# Patient Record
Sex: Female | Born: 1940
Health system: Southern US, Community
[De-identification: ages and names within clinical notes are randomized; demographics above are authoritative.]

## PROBLEM LIST (undated history)

## (undated) DIAGNOSIS — M1990 Unspecified osteoarthritis, unspecified site: Secondary | ICD-10-CM

## (undated) DIAGNOSIS — I341 Nonrheumatic mitral (valve) prolapse: Secondary | ICD-10-CM

## (undated) DIAGNOSIS — Z860100 Personal history of colon polyps, unspecified: Secondary | ICD-10-CM

## (undated) DIAGNOSIS — Z87448 Personal history of other diseases of urinary system: Secondary | ICD-10-CM

## (undated) DIAGNOSIS — F419 Anxiety disorder, unspecified: Secondary | ICD-10-CM

## (undated) DIAGNOSIS — T8859XA Other complications of anesthesia, initial encounter: Secondary | ICD-10-CM

## (undated) DIAGNOSIS — M8580 Other specified disorders of bone density and structure, unspecified site: Secondary | ICD-10-CM

## (undated) DIAGNOSIS — E785 Hyperlipidemia, unspecified: Secondary | ICD-10-CM

## (undated) DIAGNOSIS — T4145XA Adverse effect of unspecified anesthetic, initial encounter: Secondary | ICD-10-CM

## (undated) DIAGNOSIS — Z789 Other specified health status: Secondary | ICD-10-CM

## (undated) DIAGNOSIS — I872 Venous insufficiency (chronic) (peripheral): Secondary | ICD-10-CM

## (undated) DIAGNOSIS — R55 Syncope and collapse: Secondary | ICD-10-CM

## (undated) HISTORY — DX: Hyperlipidemia, unspecified: E78.5

## (undated) HISTORY — DX: Other specified disorders of bone density and structure, unspecified site: M85.80

## (undated) HISTORY — DX: Syncope and collapse: R55

## (undated) HISTORY — DX: Unspecified osteoarthritis, unspecified site: M19.90

## (undated) HISTORY — DX: Nonrheumatic mitral (valve) prolapse: I34.1

## (undated) HISTORY — PX: APPENDECTOMY: SHX54

## (undated) HISTORY — PX: JOINT REPLACEMENT: SHX530

## (undated) HISTORY — DX: Venous insufficiency (chronic) (peripheral): I87.2

## (undated) HISTORY — DX: Anxiety disorder, unspecified: F41.9

## (undated) HISTORY — DX: Personal history of other diseases of urinary system: Z87.448

## (undated) HISTORY — DX: Other specified health status: Z78.9

## (undated) HISTORY — DX: Personal history of colon polyps, unspecified: Z86.0100

## (undated) HISTORY — DX: Personal history of colonic polyps: Z86.010

---

## 1998-02-26 ENCOUNTER — Other Ambulatory Visit: Admission: RE | Admit: 1998-02-26 | Discharge: 1998-02-26 | Payer: Self-pay | Admitting: Obstetrics and Gynecology

## 2000-09-17 ENCOUNTER — Other Ambulatory Visit: Admission: RE | Admit: 2000-09-17 | Discharge: 2000-09-17 | Payer: Self-pay | Admitting: Obstetrics and Gynecology

## 2000-10-15 ENCOUNTER — Encounter: Admission: RE | Admit: 2000-10-15 | Discharge: 2000-10-15 | Payer: Self-pay | Admitting: Obstetrics and Gynecology

## 2000-10-15 ENCOUNTER — Encounter: Payer: Self-pay | Admitting: Obstetrics and Gynecology

## 2001-08-17 HISTORY — PX: OTHER SURGICAL HISTORY: SHX169

## 2002-05-16 ENCOUNTER — Encounter: Payer: Self-pay | Admitting: Urology

## 2002-05-16 ENCOUNTER — Encounter: Admission: RE | Admit: 2002-05-16 | Discharge: 2002-05-16 | Payer: Self-pay | Admitting: Urology

## 2002-12-22 ENCOUNTER — Encounter: Admission: RE | Admit: 2002-12-22 | Discharge: 2002-12-22 | Payer: Self-pay | Admitting: Obstetrics and Gynecology

## 2002-12-22 ENCOUNTER — Encounter: Payer: Self-pay | Admitting: Obstetrics and Gynecology

## 2004-05-22 ENCOUNTER — Encounter: Admission: RE | Admit: 2004-05-22 | Discharge: 2004-05-22 | Payer: Self-pay | Admitting: Obstetrics and Gynecology

## 2004-09-04 ENCOUNTER — Ambulatory Visit: Payer: Self-pay | Admitting: Pulmonary Disease

## 2004-09-16 ENCOUNTER — Ambulatory Visit: Payer: Self-pay | Admitting: Pulmonary Disease

## 2004-09-29 ENCOUNTER — Ambulatory Visit: Payer: Self-pay | Admitting: Gastroenterology

## 2004-10-08 ENCOUNTER — Ambulatory Visit: Payer: Self-pay | Admitting: Gastroenterology

## 2004-10-28 ENCOUNTER — Ambulatory Visit (HOSPITAL_BASED_OUTPATIENT_CLINIC_OR_DEPARTMENT_OTHER): Admission: RE | Admit: 2004-10-28 | Discharge: 2004-10-28 | Payer: Self-pay | Admitting: Orthopedic Surgery

## 2004-10-28 ENCOUNTER — Ambulatory Visit (HOSPITAL_COMMUNITY): Admission: RE | Admit: 2004-10-28 | Discharge: 2004-10-28 | Payer: Self-pay | Admitting: Orthopedic Surgery

## 2004-10-28 ENCOUNTER — Encounter (INDEPENDENT_AMBULATORY_CARE_PROVIDER_SITE_OTHER): Payer: Self-pay | Admitting: *Deleted

## 2005-03-23 ENCOUNTER — Ambulatory Visit: Payer: Self-pay | Admitting: Pulmonary Disease

## 2005-05-29 ENCOUNTER — Ambulatory Visit: Payer: Self-pay | Admitting: Pulmonary Disease

## 2005-06-09 ENCOUNTER — Encounter: Admission: RE | Admit: 2005-06-09 | Discharge: 2005-06-09 | Payer: Self-pay | Admitting: Obstetrics and Gynecology

## 2005-08-17 HISTORY — PX: OTHER SURGICAL HISTORY: SHX169

## 2006-02-15 ENCOUNTER — Ambulatory Visit: Payer: Self-pay | Admitting: Pulmonary Disease

## 2006-02-22 ENCOUNTER — Ambulatory Visit: Payer: Self-pay | Admitting: Pulmonary Disease

## 2006-06-08 ENCOUNTER — Ambulatory Visit: Payer: Self-pay | Admitting: Pulmonary Disease

## 2006-06-29 ENCOUNTER — Encounter: Admission: RE | Admit: 2006-06-29 | Discharge: 2006-06-29 | Payer: Self-pay | Admitting: Obstetrics and Gynecology

## 2007-05-26 ENCOUNTER — Ambulatory Visit: Payer: Self-pay | Admitting: Pulmonary Disease

## 2007-12-13 ENCOUNTER — Encounter: Payer: Self-pay | Admitting: Pulmonary Disease

## 2007-12-13 ENCOUNTER — Ambulatory Visit: Payer: Self-pay | Admitting: Internal Medicine

## 2007-12-31 ENCOUNTER — Encounter: Payer: Self-pay | Admitting: Pulmonary Disease

## 2008-02-10 DIAGNOSIS — T7840XA Allergy, unspecified, initial encounter: Secondary | ICD-10-CM | POA: Insufficient documentation

## 2008-02-10 DIAGNOSIS — E785 Hyperlipidemia, unspecified: Secondary | ICD-10-CM | POA: Insufficient documentation

## 2008-02-10 DIAGNOSIS — M1990 Unspecified osteoarthritis, unspecified site: Secondary | ICD-10-CM | POA: Insufficient documentation

## 2008-02-10 DIAGNOSIS — I872 Venous insufficiency (chronic) (peripheral): Secondary | ICD-10-CM | POA: Insufficient documentation

## 2008-02-10 DIAGNOSIS — Z87448 Personal history of other diseases of urinary system: Secondary | ICD-10-CM | POA: Insufficient documentation

## 2008-02-13 ENCOUNTER — Ambulatory Visit: Payer: Self-pay | Admitting: Pulmonary Disease

## 2008-02-13 DIAGNOSIS — M899 Disorder of bone, unspecified: Secondary | ICD-10-CM | POA: Insufficient documentation

## 2008-02-13 DIAGNOSIS — M949 Disorder of cartilage, unspecified: Secondary | ICD-10-CM

## 2008-02-13 DIAGNOSIS — D126 Benign neoplasm of colon, unspecified: Secondary | ICD-10-CM | POA: Insufficient documentation

## 2008-06-08 ENCOUNTER — Ambulatory Visit: Payer: Self-pay | Admitting: Pulmonary Disease

## 2008-07-16 ENCOUNTER — Telehealth: Payer: Self-pay | Admitting: Pulmonary Disease

## 2008-09-17 ENCOUNTER — Inpatient Hospital Stay (HOSPITAL_COMMUNITY): Admission: RE | Admit: 2008-09-17 | Discharge: 2008-09-20 | Payer: Self-pay | Admitting: Orthopedic Surgery

## 2008-09-17 HISTORY — PX: TOTAL HIP ARTHROPLASTY: SHX124

## 2009-04-17 HISTORY — PX: OTHER SURGICAL HISTORY: SHX169

## 2009-04-29 ENCOUNTER — Inpatient Hospital Stay (HOSPITAL_COMMUNITY): Admission: EM | Admit: 2009-04-29 | Discharge: 2009-05-06 | Payer: Self-pay | Admitting: Emergency Medicine

## 2009-04-29 ENCOUNTER — Encounter (INDEPENDENT_AMBULATORY_CARE_PROVIDER_SITE_OTHER): Payer: Self-pay | Admitting: General Surgery

## 2009-04-29 ENCOUNTER — Emergency Department (HOSPITAL_COMMUNITY): Admission: EM | Admit: 2009-04-29 | Discharge: 2009-04-29 | Payer: Self-pay | Admitting: Family Medicine

## 2009-05-31 ENCOUNTER — Ambulatory Visit: Payer: Self-pay | Admitting: Pulmonary Disease

## 2009-08-28 ENCOUNTER — Encounter (INDEPENDENT_AMBULATORY_CARE_PROVIDER_SITE_OTHER): Payer: Self-pay | Admitting: *Deleted

## 2009-10-25 ENCOUNTER — Encounter (INDEPENDENT_AMBULATORY_CARE_PROVIDER_SITE_OTHER): Payer: Self-pay | Admitting: *Deleted

## 2009-10-28 ENCOUNTER — Ambulatory Visit: Payer: Self-pay | Admitting: Gastroenterology

## 2009-11-06 ENCOUNTER — Ambulatory Visit: Payer: Self-pay | Admitting: Gastroenterology

## 2009-11-07 ENCOUNTER — Encounter: Payer: Self-pay | Admitting: Gastroenterology

## 2010-04-17 ENCOUNTER — Ambulatory Visit: Payer: Self-pay | Admitting: Pulmonary Disease

## 2010-04-20 DIAGNOSIS — F411 Generalized anxiety disorder: Secondary | ICD-10-CM | POA: Insufficient documentation

## 2010-05-02 ENCOUNTER — Encounter: Payer: Self-pay | Admitting: Pulmonary Disease

## 2010-05-02 ENCOUNTER — Ambulatory Visit: Payer: Self-pay | Admitting: Internal Medicine

## 2010-09-16 NOTE — Letter (Signed)
Summary: Patient Notice- Polyp Results  [facility]  [address] [place], [place] [address]   Phone: [phone]  Fax: [phone]        November 07, 2009 MRN: [id number]    [facility] [address] [place], [place]  [address]    Dear Ms. Allcock,  I am pleased to inform you that the colon polyp(s) removed during your recent colonoscopy was (were) found to be benign (no cancer detected) upon pathologic examination.  I recommend you have a repeat colonoscopy examination in 5 years to look for recurrent polyps, as having colon polyps increases your risk for having recurrent polyps or even colon cancer in the future.  Should you develop new or worsening symptoms of abdominal pain, bowel habit changes or bleeding from the rectum or bowels, please schedule an evaluation with either your primary care physician or with me.  Continue treatment plan as outlined the day of your exam.  Please call us if you are having persistent problems or have questions about your condition that have not been fully answered at this time.  Sincerely,  Meryl Dare MD [facility]  This letter has been electronically signed by your physician.  Appended Document: Patient Notice- Polyp Results letter mailed 3.28.11

## 2010-09-16 NOTE — Procedures (Signed)
Summary: Colonoscopy  Patient: Morgan [name] Note: All result statuses are Final unless otherwise noted.  Tests: (1) Colonoscopy (COL)   COL Colonoscopy           DONE      [facility]     [address]     [place], [place]  [address]           COLONOSCOPY PROCEDURE REPORT           PATIENT:  [name], [name]  MR#:  [id number]     BIRTHDATE:  November 03, 1940, 68 yrs. old  GENDER:  female           ENDOSCOPIST:  Judie Petit T. Russella Dar, MD, [facility]           PROCEDURE DATE:  11/06/2009     PROCEDURE:  Colonoscopy with biopsy     ASA CLASS:  Class II     INDICATIONS:  1) follow-up of polyp, adenomatous polyp, 09/2004.           MEDICATIONS:   Fentanyl 75 mcg IV, Versed 7 mg IV           DESCRIPTION OF PROCEDURE:   After the risks benefits and     alternatives of the procedure were thoroughly explained, informed     consent was obtained.  Digital rectal exam was performed and     revealed no abnormalities.   The LB [id number]-[id number] endoscope     was introduced through the anus and advanced to the cecum, which     was identified by both the appendix and ileocecal valve, without     limitations.  The quality of the prep was excellent, using     MoviPrep.  The instrument was then slowly withdrawn as the colon     was fully examined.     <<PROCEDUREIMAGES>>           FINDINGS:  A sessile polyp was found in the cecum. It was 3 mm in     size. The polyp was removed using cold biopsy forceps.  A sessile     polyp was found in the proximal transverse colon. It was 4 mm in     size. The polyp was removed using cold biopsy forceps.  A sessile     polyp was found in the sigmoid colon. It was 3 mm in size. The     polyp was removed using cold biopsy forceps.  A sessile polyp was     found in the rectum. It was 4 mm in size. The polyp was removed     using cold biopsy forceps.  This was otherwise a normal     examination of the colon.  Retroflexed views in the rectum     revealed no abnormalities.  The time to cecum =  2.67  minutes. The     scope was then withdrawn (time =  9.25  min) from the patient and     the procedure completed.           COMPLICATIONS:  None           ENDOSCOPIC IMPRESSION:     1) 3 mm sessile polyp in the cecum     2) 4 mm sessile polyp in the proximal transverse colon     3) 3 mm sessile polyp in the sigmoid colon     4) 4 mm sessile polyp in the rectum           RECOMMENDATIONS:  1) Await pathology results     2) Repeat Colonoscopy in 5 years.           Venita Lick. Russella Dar, MD, [facility]           CC: Michele Mcalpine, MD           n.     Rosalie DoctorVenita Lick. Caelen Reierson at 11/06/2009 11:42 AM           Ilda Foil, [id number]  Note: An exclamation mark (!) indicates a result that was not dispersed into the flowsheet. Document Creation Date: 11/06/2009 11:43 AM _______________________________________________________________________  (1) Order result status: Final Collection or observation date-time: 11/06/2009 11:38 Requested date-time:  Receipt date-time:  Reported date-time:  Referring Physician:   Ordering Physician: Claudette Head [phone]) Specimen Source:  Source: Launa Grill Order Number: [id number] Lab site:   Appended Document: Colonoscopy     Procedures Next Due Date:    Colonoscopy: 10/2014

## 2010-09-16 NOTE — Miscellaneous (Signed)
Summary: [facility] Previsit/prep  Clinical Lists Changes  Medications: Added new medication of MOVIPREP 100 GM  SOLR (PEG-KCL-NACL-NASULF-NA ASC-C) As per prep instructions. - Signed Rx of MOVIPREP 100 GM  SOLR (PEG-KCL-NACL-NASULF-NA ASC-C) As per prep instructions.;  #1 x 0;  Signed;  Entered by: Wyona Almas RN;  Authorized by: Meryl Dare MD [facility];  Method used: Electronically to [facility]  [phone]*, [address] [place], [place], [place]  [address], Ph: [phone] or [phone], Fax: [phone] Allergies: Added new allergy or adverse reaction of PENICILLIN    Prescriptions: MOVIPREP 100 GM  SOLR (PEG-KCL-NACL-NASULF-NA ASC-C) As per prep instructions.  #1 x 0   Entered by:   Wyona Almas RN   Authorized by:   Meryl Dare MD [facility]   Signed by:   Wyona Almas RN on 10/28/2009   Method used:   Electronically to        [facility]  [phone]* (retail)       [address]       [place], [place]  [address]       Ph: [phone] or [phone]       Fax: [phone]   RxID:   [id number]

## 2010-09-16 NOTE — Letter (Signed)
Summary: Colonoscopy Letter  [facility]  [address] [place], [place] [address]   Phone: [phone]  Fax: [phone]      August 28, 2009 MRN: [id number]   [facility] [address] [place], [place]  [address]   Dear Ms. Trefry,   According to your medical record, it is time for you to schedule a Colonoscopy. The American Cancer Society recommends this procedure as a method to detect early colon cancer. Patients with a family history of colon cancer, or a personal history of colon polyps or inflammatory bowel disease are at increased risk.  This letter has beeen generated based on the recommendations made at the time of your procedure. If you feel that in your particular situation this may no longer apply, please contact our office.  Please call our office at [phone] to schedule this appointment or to update your records at your earliest convenience.  Thank you for cooperating with [place] to provide you with the very best care possible.   Sincerely,  Judie Petit T. Russella Dar, M.D.  [facility] [phone]

## 2010-09-16 NOTE — Miscellaneous (Signed)
Summary: BONE DENSITY  Clinical Lists Changes  Orders: Added new Test order of T-Bone Densitometry [phone]) - Signed Added new Test order of T-Lumbar Vertebral Assessment [phone]) - Signed  Appended Document: BONE DENSITY called and spoke with pt and she is aware per SN the results of the BMD----this compared to 2009 is slightly worse in the hip---recs are to cont the same meds and add alendronate 70mg  once weekly---she wanted this sent to [facility].  Appended Document: alendronate added to meds    Clinical Lists Changes  Medications: Added new medication of ALENDRONATE SODIUM 70 MG TABS (ALENDRONATE SODIUM) take one tablet by mouth once weekly - Signed Rx of ALENDRONATE SODIUM 70 MG TABS (ALENDRONATE SODIUM) take one tablet by mouth once weekly;  #4 x 11;  Signed;  Entered by: Randell Loop CMA;  Authorized by: Michele Mcalpine MD;  Method used: Electronically to [facility]  [phone]*, [address] [place], [place], [place]  [address], Ph: [phone] or [phone], Fax: [phone]    Prescriptions: ALENDRONATE SODIUM 70 MG TABS (ALENDRONATE SODIUM) take one tablet by mouth once weekly  #4 x 11   Entered by:   Randell Loop CMA   Authorized by:   Michele Mcalpine MD   Signed by:   Randell Loop CMA on 05/16/2010   Method used:   Electronically to        [facility]  [phone]* (retail)       [address]       [place], [place]  [address]       Ph: [phone] or [phone]       Fax: [phone]   RxID:   [id number]

## 2010-09-16 NOTE — Letter (Signed)
Summary: [facility] Instructions   [facility]  [address] [place], [place] [address]   Phone: [phone]  Fax: [phone]       Morgan [name]    1941-03-13    MRN: [id number]        Procedure Day Dorna Bloom: Wednesday 11/06/09     Arrival Time: 10:00 am     Procedure Time: 11:00 am     Location of Procedure:                    _x_  [facility] (4th Floor)                        PREPARATION FOR COLONOSCOPY WITH MOVIPREP   Starting 5 days prior to your procedure 11/01/09 do not eat nuts, seeds, popcorn, corn, beans, peas,  salads, or any raw vegetables.  Do not take any fiber supplements (e.g. Metamucil, Citrucel, and Benefiber).  THE DAY BEFORE YOUR PROCEDURE         DATE: 11/05/09  DAY: Tuesday  1.  Drink clear liquids the entire day-NO SOLID FOOD  2.  Do not drink anything colored red or purple.  Avoid juices with pulp.  No orange juice.  3.  Drink at least 64 oz. (8 glasses) of fluid/clear liquids during the day to prevent dehydration and help the prep work efficiently.  CLEAR LIQUIDS INCLUDE: Water Jello Ice Popsicles Tea (sugar ok, no milk/cream) Powdered fruit flavored drinks Coffee (sugar ok, no milk/cream) Gatorade Juice: apple, white grape, white cranberry  Lemonade Clear bullion, consomm, broth Carbonated beverages (any kind) Strained chicken noodle soup Hard Candy                             4.  In the morning, mix first dose of MoviPrep solution:    Empty 1 Pouch A and 1 Pouch B into the disposable container    Add lukewarm drinking water to the top line of the container. Mix to dissolve    Refrigerate (mixed solution should be used within 24 hrs)  5.  Begin drinking the prep at 5:00 p.m. The MoviPrep container is divided by 4 marks.   Every 15 minutes drink the solution down to the next mark (approximately 8 oz) until the full liter is complete.   6.  Follow completed prep with 16 oz of clear liquid of your choice (Nothing  red or purple).  Continue to drink clear liquids until bedtime.  7.  Before going to bed, mix second dose of MoviPrep solution:    Empty 1 Pouch A and 1 Pouch B into the disposable container    Add lukewarm drinking water to the top line of the container. Mix to dissolve    Refrigerate  THE DAY OF YOUR PROCEDURE      DATE: 11/06/09 DAY: Wednesday  Beginning at  6:00 a.m. (5 hours before procedure):         1. Every 15 minutes, drink the solution down to the next mark (approx 8 oz) until the full liter is complete.  2. Follow completed prep with 16 oz. of clear liquid of your choice.    3. You may drink clear liquids until 9:00 am (2 HOURS BEFORE PROCEDURE).   MEDICATION INSTRUCTIONS  Unless otherwise instructed, you should take regular prescription medications with a small sip of water   as early as possible the morning of  your procedure.           OTHER INSTRUCTIONS  You will need a responsible adult at least 72 years of age to accompany you and drive you home.   This person must remain in the waiting room during your procedure.  Wear loose fitting clothing that is easily removed.  Leave jewelry and other valuables at home.  However, you may wish to bring a book to read or  an iPod/MP3 player to listen to music as you wait for your procedure to start.  Remove all body piercing jewelry and leave at home.  Total time from sign-in until discharge is approximately 2-3 hours.  You should go home directly after your procedure and rest.  You can resume normal activities the  day after your procedure.  The day of your procedure you should not:   Drive   Make legal decisions   Operate machinery   Drink alcohol   Return to work  You will receive specific instructions about eating, activities and medications before you leave.    The above instructions have been reviewed and explained to me by   Wyona Almas RN  October 28, 2009 9:39 AM     I fully  understand and can verbalize these instructions _____________________________ Date _________

## 2010-09-16 NOTE — Assessment & Plan Note (Signed)
Summary: rov//mbw   CC:  26 month ROV & review of mult medical problems....  History of Present Illness: 70 y/o WF here for a follow up visit...    ~  Jun09:  she was last seen 7/07- her husband, Charnese Federici died last yr w/ complic of DM... she states that she has been doing well and has no new complaints or concerns... she was prev on Actonel for Osteopenia but stopped this med due to bone pain and hurting all over (she states that she feels better off this Rx)... she has several medical issues as noted below:   ~  April 17, 2010:  she had right THR 2/10 by DrAlusio... she was adm 9/10 for ruptured appendix w/ laparoscopic surg DrThompson- appendectomy & drainage of abscess... she had f/u w/ GI- DrStark 3/11 w/ colonoscopy showing several 3-10mm polyps that were tubular adenomas on bx, f/u planned 58yrs... she has hx ?MVP- denies CP, palpit, SOB;  hx signif elevation in Chol but she refuses Statin Rx "I'm on FishOil" & refuses FLP recheck!;  notes under incr stress w/ father's illness but does not want meds... OK Flu vaccine & PNEUMOVAX today (age 29).   Current Problem List  ALLERGY (ICD-995.3) - no recent problems... uses OTC meds as needed.  ? of MITRAL VALVE PROLAPSE (ICD-424.0) - long hx of intermittent palpit and occas sharp CP... prev exams w/ intermittent MSC heard... never required 2DEcho or other eval... currently asymptomatic without CP, palpit, dizziness, SOB, etc...  VENOUS INSUFFICIENCY (ICD-459.81) - mild VI changes and no signif edema... she knows to avoid sodium, elevate legs, wear support hose, etc...  HYPERLIPIDEMIA (ICD-272.4) - signif elevation of Chol in the past... intol to Lipitor w/ bone pain and hurting all over... refused to try other statins, or other Chol meds, preferring to attempt control on diet alone... she is not fasting today and does not wish to return for f/u FLP or other labs...  ~  FLP 5/05 showed TChol 259, TG 46, HDL 64, LDL 182... she tried  Lipitor, then stopped...  ~  FLP 7/07 showed TChol 230, TG 54, HDL 50, LDL 187... she refused Simvastatin trial...  ~  she has declines fasting blood work since Jul07.  COLONIC POLYPS (ICD-211.3) - colonoscopy 2/06 by DrStark showed 2mm polyps w/ adenomatous and hyperplastic changes...  f/u colonoscopy done 3/11 w/ several 3-53mm polyps removed- path= tubular adenoma & f/u planned 56yrs.  HEMATURIA, HX OF (ICD-V13.09) - eval 2003 by DrTannenbaum w/ neg sonar and cysto...  DEGENERATIVE JOINT DISEASE (ICD-715.90) - s/p right hand surgery 2006 by DrAplington to remove some benign nodules...  ~  she has right THR 2/10 by DrAlusio...  OSTEOPENIA (ICD-733.90) - BMD 4/09 w/ TScores -1.5 in sp, & -2.1 in hip... she takes ca++, MVI, VitD... she refuses bisphosphonate therapy...  she is rec to get a f/u BMD & asked to sched this at her convenience.  ANXIETY - c/o incr stress w/ father's illness... she is offered Rebeca Allegra Rx but she declines.  DERM = DrHall w/ mult SK's and actinic areas treated in the past... Basal cell skin cancer removed from right ear by drRosen in 2007...   Preventive Screening-Counseling & Management  Alcohol-Tobacco     Smoking Status: never  Allergies: 1)  ! Lipitor (Atorvastatin Calcium) 2)  ! Penicillin  Comments:  Nurse/Medical Assistant: The patient's medications and allergies were reviewed with the patient and were updated in the Medication and Allergy Lists.  Past History:  Past Medical History: ALLERGY (ICD-995.3) ? of MITRAL VALVE PROLAPSE (ICD-424.0) VENOUS INSUFFICIENCY (ICD-459.81) HYPERLIPIDEMIA (ICD-272.4) COLONIC POLYPS (ICD-211.3) HEMATURIA, HX OF (ICD-V13.09) DEGENERATIVE JOINT DISEASE (ICD-715.90) OSTEOPENIA (ICD-733.90) ANXIETY (ICD-300.00)  Past Surgical History: S/P right hand surgery 12/27/2001 by DrAplington S/P right ear surgery 12-27-05 by DrRosen (skin cancer)  Family History: Reviewed history and no changes required.  Social  History: Reviewed history from 02/13/2008 and no changes required. Husband, Quida Glasser, passed away in Dec 28, 2006 2 children non smoker no etoh retired  Review of Systems  The patient denies fever, chills, sweats, anorexia, fatigue, weakness, malaise, weight loss, sleep disorder, blurring, diplopia, eye irritation, eye discharge, vision loss, eye pain, photophobia, earache, ear discharge, tinnitus, decreased hearing, nasal congestion, nosebleeds, sore throat, hoarseness, chest pain, palpitations, syncope, dyspnea on exertion, orthopnea, PND, peripheral edema, cough, dyspnea at rest, excessive sputum, hemoptysis, wheezing, pleurisy, nausea, vomiting, diarrhea, constipation, change in bowel habits, abdominal pain, melena, hematochezia, jaundice, gas/bloating, indigestion/heartburn, dysphagia, odynophagia, dysuria, hematuria, urinary frequency, urinary hesitancy, nocturia, incontinence, back pain, joint pain, joint swelling, muscle cramps, muscle weakness, stiffness, arthritis, sciatica, restless legs, leg pain at night, leg pain with exertion, rash, itching, dryness, suspicious lesions, paralysis, paresthesias, seizures, tremors, vertigo, transient blindness, frequent falls, frequent headaches, difficulty walking, depression, anxiety, memory loss, confusion, cold intolerance, heat intolerance, polydipsia, polyphagia, polyuria, unusual weight change, abnormal bruising, bleeding, enlarged lymph nodes, urticaria, allergic rash, hay fever, and recurrent infections.    Vital Signs:  Patient profile:   70 year old female Height:      66.5 inches Weight:      190.50 pounds BMI:     30.40 O2 Sat:      97 % on Room air Temp:     97.8 degrees F oral Pulse rate:   65 / minute BP sitting:   120 / 78  (right arm) Cuff size:   regular  Vitals Entered By: Randell Loop CMA (April 17, 2010 10:34 AM)  O2 Sat at Rest %:  97 O2 Flow:  Room air CC: 26 month ROV & review of mult medical problems... Is Patient  Diabetic? No Pain Assessment Patient in pain? no      Comments meds updated today with pt   Physical Exam  Additional Exam:  WD, WN, 70 y/o WF in NAD... GENERAL:  Alert & oriented; pleasant & cooperative... HEENT:  Wells/AT, EOM-wnl, PERRLA, EACs-clear, TMs-wnl, NOSE-clear, THROAT-clear & wnl, dentures... NECK:  Supple w/ full ROM; no JVD; normal carotid impulses w/o bruits; palp thyroid w/o nodules ;  no lymphadenopathy. CHEST:  Clear to P & A; without wheezes/ rales/ or rhonchi. HEART:  Regular Rhythm; without murmurs/ rubs/ or gallops. ABDOMEN:  Soft & nontender; normal bowel sounds; no organomegaly or masses detected. EXT: without deformities, mod arthritic changes; no varicose veins/ +venous insuffic/ no edema. NEURO:  CN's intact; no focal neuro deficits... DERM:  No lesions noted; no rash etc...    Impression & Recommendations:  Problem # 1:  ? of MITRAL VALVE PROLAPSE (ICD-424.0) She is asymptomatic w/o CP palpit SOB etc... Her updated medication list for this problem includes:    Aspirin 81 Mg Tbec (Aspirin) .Marland Kitchen... Take 1 tab by mouth once daily...  Problem # 2:  HYPERLIPIDEMIA (ICD-272.4) She has categorically refused meds and even refuses f/u FLP... "I'm taking fish Oil"...  Problem # 3:  COLONIC POLYPS (ICD-211.3) S/p colonoscopy w/ several 3-21mm tubular adenomas removed... f/u planned 79yrs...  Problem # 4:  S/P APPENDECTOMY>>> She has acute appendicitis w/  perf & surg DrThompson 9/10...  Problem # 5:  DEGENERATIVE JOINT DISEASE (ICD-715.90) S/p right THR by DrAlusio 2./10... Her updated medication list for this problem includes:    Aspirin 81 Mg Tbec (Aspirin) .Marland Kitchen... Take 1 tab by mouth once daily...  Problem # 6:  OSTEOPENIA (ICD-733.90) She will sched a f/u BMD at her convenience...  Problem # 7:  OTHER MEDICAL PROBLEMS AS NOTED>>> OK 2011 Flu vccine today, and PNEUMOVAX...  Complete Medication List: 1)  Aspirin 81 Mg Tbec (Aspirin) .... Take 1 tab by  mouth once daily.Marland KitchenMarland Kitchen 2)  Fish Oil 1000 Mg Caps (Omega-3 fatty acids) .... Take 1 tablet by mouth once a day 3)  Caltrate 600+d 600-400 Mg-unit Tabs (Calcium carbonate-vitamin d) .... Take 1 tab by mouth once daily.Marland Kitchen 4)  Multivitamins Tabs (Multiple vitamin) .... Take 1 tablet by mouth once daily 5)  Vitamin D3 1000 Unit Caps (Cholecalciferol) .... Take 1 cap by mouth once daily...  Other Orders: Pneumococcal Vaccine ([address]) Admin 1st Vaccine (09811) Flu Vaccine 70yrs + [phone]) Administration Flu vaccine - MCR (G9562)  Patient Instructions: 1)  Today we updated your med list- see below.... 2)  We gave you the 2011 seasonal Flu vaccine today, and the PNEUMOVAX (this is the only Pneumonia vaccine you will need)...  3)  Please get on a good low cholesterol/ low fat diet & get your weight down> you will not be able to control your Lipids on diet alone unless you lose a substantial amount of weight... 4)  Continue daily exercise/ walking/ weight bearing exercise for your bones... plus the calcium, MVI, Vit D supplements... sched the follow up bone density test at your convenience... 5)  Call for any problems.Marland KitchenMarland Kitchen 6)  Please schedule a follow-up appointment in 1 year, sooner as needed.   Immunizations Administered:  Pneumonia Vaccine:    Vaccine Type: Pneumovax    Site: right deltoid    Mfr: Merck    Dose: 0.5 ml    Route: IM    Given by: Boone Master CNA/MA    Exp. Date: 09/03/2011    Lot #: [id number]    VIS given: 03/14/96 version given April 17, 2010.            Flu Vaccine Consent Questions     Do you have a history of severe allergic reactions to this vaccine? no    Any prior history of allergic reactions to egg and/or gelatin? no    Do you have a sensitivity to the preservative Thimersol? no    Do you have a past history of Guillan-Barre Syndrome? no    Do you currently have an acute febrile illness? no    Have you ever had a severe reaction to latex? no    Vaccine  information given and explained to patient? yes    Are you currently pregnant? no    Lot Number:[id number]   Exp Date:02/14/2011   Site Given  Left Deltoid IMlu  Boone Master CNA/MA  April 17, 2010 12:10 PM

## 2010-11-21 LAB — URINE MICROSCOPIC-ADD ON

## 2010-11-21 LAB — DIFFERENTIAL
Basophils Absolute: 0 10*3/uL (ref 0.0–0.1)
Basophils Relative: 0 % (ref 0–1)
Eosinophils Absolute: 0.2 10*3/uL (ref 0.0–0.7)
Eosinophils Absolute: 0 10*3/uL (ref 0.0–0.7)
Eosinophils Relative: 5 % (ref 0–5)
Eosinophils Relative: 0 % (ref 0–5)
Lymphocytes Relative: 17 % (ref 12–46)
Lymphocytes Relative: 23 % (ref 12–46)
Lymphs Abs: 0.9 10*3/uL (ref 0.7–4.0)
Lymphs Abs: 1.1 10*3/uL (ref 0.7–4.0)
Lymphs Abs: 0.5 10*3/uL — ABNORMAL LOW (ref 0.7–4.0)
Monocytes Absolute: 0.6 10*3/uL (ref 0.1–1.0)
Monocytes Relative: 9 % (ref 3–12)
Monocytes Relative: 5 % (ref 3–12)
Neutro Abs: 3.6 10*3/uL (ref 1.7–7.7)
Neutrophils Relative %: 69 % (ref 43–77)

## 2010-11-21 LAB — CBC
HCT: 30.6 % — ABNORMAL LOW (ref 36.0–46.0)
HCT: 32 % — ABNORMAL LOW (ref 36.0–46.0)
HCT: 31.2 % — ABNORMAL LOW (ref 36.0–46.0)
Hemoglobin: 10.9 g/dL — ABNORMAL LOW (ref 12.0–15.0)
Hemoglobin: 10.8 g/dL — ABNORMAL LOW (ref 12.0–15.0)
Hemoglobin: 12.8 g/dL (ref 12.0–15.0)
MCHC: 34.3 g/dL (ref 30.0–36.0)
MCHC: 34.5 g/dL (ref 30.0–36.0)
MCV: 90.2 fL (ref 78.0–100.0)
MCV: 90.4 fL (ref 78.0–100.0)
Platelets: 200 10*3/uL (ref 150–400)
Platelets: 285 10*3/uL (ref 150–400)
RBC: 3.54 MIL/uL — ABNORMAL LOW (ref 3.87–5.11)
RBC: 3.41 MIL/uL — ABNORMAL LOW (ref 3.87–5.11)
RBC: 4.11 MIL/uL (ref 3.87–5.11)
RDW: 13.7 % (ref 11.5–15.5)
WBC: 4.7 10*3/uL (ref 4.0–10.5)
WBC: 5.2 10*3/uL (ref 4.0–10.5)
WBC: 5.6 10*3/uL (ref 4.0–10.5)

## 2010-11-21 LAB — COMPREHENSIVE METABOLIC PANEL
ALT: 15 U/L (ref 0–35)
AST: 21 U/L (ref 0–37)
Albumin: 3.3 g/dL — ABNORMAL LOW (ref 3.5–5.2)
Alkaline Phosphatase: 81 U/L (ref 39–117)
Calcium: 8.4 mg/dL (ref 8.4–10.5)
GFR calc Af Amer: 58 mL/min — ABNORMAL LOW (ref >60)
Glucose, Bld: 154 mg/dL — ABNORMAL HIGH (ref 70–99)
Potassium: 3.8 mEq/L (ref 3.5–5.1)
Sodium: 136 mEq/L (ref 135–145)
Total Protein: 5.9 g/dL — ABNORMAL LOW (ref 6.0–8.3)

## 2010-11-21 LAB — URINALYSIS, ROUTINE W REFLEX MICROSCOPIC
Bilirubin Urine: NEGATIVE
Glucose, UA: NEGATIVE mg/dL
Glucose, UA: NEGATIVE mg/dL
Ketones, ur: 15 mg/dL — AB
Protein, ur: 30 mg/dL — AB
Specific Gravity, Urine: 1.011 (ref 1.005–1.030)
Urobilinogen, UA: 1 mg/dL (ref 0.0–1.0)
pH: 5.5 (ref 5.0–8.0)

## 2010-11-21 LAB — WOUND CULTURE

## 2010-11-21 LAB — URINE CULTURE

## 2010-11-21 LAB — ANAEROBIC CULTURE

## 2010-11-21 LAB — LACTIC ACID, PLASMA: Lactic Acid, Venous: 1.6 mmol/L (ref 0.5–2.2)

## 2010-12-01 LAB — COMPREHENSIVE METABOLIC PANEL
AST: 20 U/L (ref 0–37)
Albumin: 3.3 g/dL — ABNORMAL LOW (ref 3.5–5.2)
BUN: 14 mg/dL (ref 6–23)
CO2: 26 mEq/L (ref 19–32)
Calcium: 9.3 mg/dL (ref 8.4–10.5)
Creatinine, Ser: 0.82 mg/dL (ref 0.4–1.2)
GFR calc Af Amer: >60 mL/min (ref >60)
GFR calc non Af Amer: >60 mL/min (ref >60)
Total Bilirubin: 0.6 mg/dL (ref 0.3–1.2)

## 2010-12-01 LAB — CBC
MCHC: 33.7 g/dL (ref 30.0–36.0)
MCV: 90.2 fL (ref 78.0–100.0)
RBC: 4.2 MIL/uL (ref 3.87–5.11)
RDW: 13.6 % (ref 11.5–15.5)

## 2010-12-01 LAB — URINE MICROSCOPIC-ADD ON

## 2010-12-01 LAB — APTT: aPTT: 32 seconds (ref 24–37)

## 2010-12-01 LAB — URINALYSIS, ROUTINE W REFLEX MICROSCOPIC
Bilirubin Urine: NEGATIVE
Ketones, ur: NEGATIVE mg/dL
Leukocytes, UA: NEGATIVE
Nitrite: NEGATIVE
Protein, ur: NEGATIVE mg/dL
Urobilinogen, UA: 0.2 mg/dL (ref 0.0–1.0)
pH: 5.5 (ref 5.0–8.0)

## 2010-12-01 LAB — PROTIME-INR: INR: 1 (ref 0.00–1.49)

## 2010-12-02 LAB — PROTIME-INR
INR: 1.2 (ref 0.00–1.49)
INR: 1.5 (ref 0.00–1.49)
INR: 1.7 — ABNORMAL HIGH (ref 0.00–1.49)
Prothrombin Time: 15.1 seconds (ref 11.6–15.2)
Prothrombin Time: 18.4 seconds — ABNORMAL HIGH (ref 11.6–15.2)

## 2010-12-02 LAB — CBC
HCT: 26.8 % — ABNORMAL LOW (ref 36.0–46.0)
Hemoglobin: 9.3 g/dL — ABNORMAL LOW (ref 12.0–15.0)
Hemoglobin: 9.6 g/dL — ABNORMAL LOW (ref 12.0–15.0)
MCHC: 34.7 g/dL (ref 30.0–36.0)
MCV: 89.7 fL (ref 78.0–100.0)
MCV: 89.9 fL (ref 78.0–100.0)
Platelets: 180 10*3/uL (ref 150–400)
RBC: 2.98 MIL/uL — ABNORMAL LOW (ref 3.87–5.11)
RBC: 3.13 MIL/uL — ABNORMAL LOW (ref 3.87–5.11)
RBC: 3.19 MIL/uL — ABNORMAL LOW (ref 3.87–5.11)
WBC: 5.1 10*3/uL (ref 4.0–10.5)
WBC: 5.6 10*3/uL (ref 4.0–10.5)

## 2010-12-02 LAB — TYPE AND SCREEN
ABO/RH(D): O POS
Antibody Screen: NEGATIVE

## 2010-12-02 LAB — BASIC METABOLIC PANEL
CO2: 25 mEq/L (ref 19–32)
Calcium: 8.2 mg/dL — ABNORMAL LOW (ref 8.4–10.5)
Chloride: 107 mEq/L (ref 96–112)
Creatinine, Ser: 0.93 mg/dL (ref 0.4–1.2)
GFR calc Af Amer: >60 mL/min (ref >60)
GFR calc Af Amer: >60 mL/min (ref >60)
GFR calc non Af Amer: >60 mL/min (ref >60)
Glucose, Bld: 127 mg/dL — ABNORMAL HIGH (ref 70–99)
Potassium: 3.9 mEq/L (ref 3.5–5.1)
Sodium: 135 mEq/L (ref 135–145)
Sodium: 137 mEq/L (ref 135–145)

## 2010-12-30 NOTE — Op Note (Signed)
Morgan [name], Morgan [name]                 ACCOUNT NO.:  [id number]   MEDICAL RECORD NO.:  [id number]          PATIENT TYPE:  INP   LOCATION:  0001                         FACILITY:  [facility]   PHYSICIAN:  Ollen Gross, M.D.    DATE OF BIRTH:  07/01/1941   DATE OF PROCEDURE:  09/17/2008  DATE OF DISCHARGE:                               OPERATIVE REPORT   PREOPERATIVE DIAGNOSIS:  Osteoarthritis right hip.   POSTOPERATIVE DIAGNOSIS:  Osteoarthritis right hip.   PROCEDURE:  Right total hip arthroplasty.   SURGEON:  Ollen Gross, M.D.   ASSISTANT:  Avel Peace PA-C   ANESTHESIA:  General.   ESTIMATED BLOOD LOSS:  300   DRAIN:  Hemovac times one.   COMPLICATIONS:  None.   CONDITION:  Stable to recovery room.   CLINICAL NOTE:  Morgan [name] is a 70 year old female with end-stage  arthritis of the right hip with progressively worsening pain and  dysfunction.  She has failed nonoperative management and presents now  for left total hip arthroplasty.   PROCEDURE IN DETAIL:  After successful administration of general  anesthetic, the patient is placed in left lateral decubitus position  with the right side up and held with the positioner.  Right lower  extremity isolated from her perineum with plastic drapes and prepped and  draped in the usual sterile fashion.  Short posterolateral incision is  made with a 10 blade through subcutaneous tissue to the level of fascia  lata which was incised in line with the skin incision.  The sciatic  nerve was palpated and protected and short external rotators isolated  off the femur.  Capsulectomy is performed and the hip was dislocated.  The center of femoral head is marked and a trial prosthesis is placed  such that the center of the trial head corresponds to the center of her  native femoral head.  Osteotomy lines marked on the femoral neck and  osteotomy made with an oscillating saw.  Femoral head removed and the  femur was retracted anteriorly to gain  acetabular exposure.   Acetabular retractors were placed and labrum and osteophytes removed.  Acetabular reaming starts at 45 mm coursing in increments of 2-53 then a  54-mm pinnacle acetabular shell was placed in anatomic position and  transfixed with two dome screws.  Trial 32-mm neutral +4 liner was  placed.   The femur was prepared with the canal finder and irrigation.  Axial  reaming is performed up to 13.5 mm proximal reaming to 18 D and the  sleeve is changed to a large.  The  18 D large trial sleeve is placed at  the 18 x 13 stem and 36 +8 neck matching native anteversion.  A 32 +0  head is placed and the hip is reduced with outstanding stability.  There  is full extension, full external rotation 70 degrees flexion, 40 degrees  adduction 90 degrees internal rotation and 90 degrees of flexion and 40  degrees of internal rotation.  By placing the right leg on top of the  left it felt as though leg lengths are  equal.  The hip was then  dislocated and all trials removed.  Permanent apex hole eliminator is  placed in the acetabular shell and permanent 32 mm neutral +4 marathon  liner was placed.   On the femoral side we placed 18 D large sleeve and 18 x 13 stem and 36  +8 neck matching native anteversion.  The 32 posterior +0 head is placed  and the hip is reduced with same stability parameters.  Wounds copiously  irrigated with saline solution and short rotators reattached to the  femur through drill holes.  Fascia lata was closed over Hemovac drain  with interrupted #1 Vicryl, subcu closed #1-0 and #2-0 Vicryl and  subcuticular running 4-0 Monocryl.  The incision is cleaned and dried  and Steri-Strips and bulky sterile dressing are applied.  The drain is  hooked to suction and she is placed a knee immobilizer, awakened and  transferred to recovery in stable condition.      Ollen Gross, M.D.  Electronically Signed     FA/MEDQ  D:  09/17/2008  T:  09/18/2008  Job:   [id number]

## 2010-12-30 NOTE — H&P (Signed)
Morgan [name], Morgan [name]                 ACCOUNT NO.:  [id number]   MEDICAL RECORD NO.:  [id number]          PATIENT TYPE:  INP   LOCATION:  NA                           FACILITY:  [facility]   PHYSICIAN:  Ollen Gross, M.D.    DATE OF BIRTH:  1941/06/20   DATE OF ADMISSION:  09/17/2008  DATE OF DISCHARGE:                              HISTORY & PHYSICAL   CHIEF COMPLAINT:  Right hip pain.   HISTORY OF PRESENT ILLNESS:  The patient is 70 year old female who has  been seen by Dr. Lequita Halt for ongoing right hip pain.  She is known to  have significant end-stage arthritis.  She has been seen by Dr. Madelon Lips  in the past. She has been treated conservatively utilizing anti-  inflammatories. Unfortunately, she continues to have pain.  She is at a  point where it is felt she would benefit undergoing surgical  intervention.  Risks and benefits have been discussed.  She elects to  proceed with surgery.   ALLERGIES:  PENICILLIN which only caused a local reaction at an  injection site.  No other reactions.   CURRENT MEDICATIONS:  Aspirin, Caltrate plus D, multivitamin,  glucosamine chondroitin, fish oil and Aleve.   PAST MEDICAL HISTORY:  1. Mitral valve prolapse.  2. Hyperlipidemia.  3. History of colonic polyps.  4. Degenerative joint disease.  5. Childhood illnesses of measles and mumps.  6. She had an incisional infection following a C-section.   PAST SURGICAL HISTORY:  Right hand surgery, cesarean section, right ear  surgery, and D and C.   FAMILY HISTORY:  Mother living, age 35, light MI, history of oral  cancer and also, bypass surgery.  __________ deceased age 48 with heart  problems, question blood clot, and a heart attack.   SOCIAL HISTORY:  Widowed, retired, nonsmoker.  No alcohol.  Two sons.  Lives alone.  Does have a caregiver lined up.  Lives in a 2-story home  with 5 steps entering.   REVIEW OF SYSTEMS:  GENERAL:  No fevers or night sweats.  NEURO:  No  seizures, syncope, or  paralysis.  RESPIRATORY: No shortness breath,  productive cough or hemoptysis.  CARDIOVASCULAR:  No chest pains or  orthopnea.  GI: No nausea, vomiting, diarrhea, or constipation.  GU: A  little bit of nocturia.  No dysuria, hematuria.  MUSCULOSKELETAL:  Joint  pain.   PHYSICAL EXAMINATION:  VITAL SIGNS:  Pulse 72, respirations 14, blood  pressure 126/68.  GENERAL:  This is a 70 year old white female, well-nourished, well-  developed, no acute distress.  She is alert, oriented, and cooperative.  HEENT: Normocephalic, atraumatic.  Pupils are round and reactive.  TMs  intact.  Does have bilateral eye contacts, upper and lower denture  plates.  NECK:: Supple.  No bruits.  CHEST: Clear.  HEART: Regular rate and rhythm.  No murmur.  ABDOMEN:  Soft, nontender, slightly round.  Bowel sounds are present.  BREASTS/GENITALIA:  Not done, no pertinent to present illness.  EXTREMITIES:  Right hip flexion 90, intervention internal rotation zero,  external rotation 10-15 degrees of abduction.  IMPRESSION:  Osteoarthritis, right hip.   PLAN:  The patient is admitted to [facility] to undergo a  right total hip replacement arthroplasty.  Surgery will be performed by  Ollen Gross.      Alexzandrew L. Perkins, P.A.C.      Ollen Gross, M.D.  Electronically Signed    ALP/MEDQ  D:  09/16/2008  T:  09/17/2008  Job:  [id number]   cc:   Lonzo Cloud. Kriste Basque, MD  [address]  [place]  [place] [address]   Ollen Gross, M.D.  Fax: [phone]

## 2011-01-02 NOTE — Discharge Summary (Signed)
Morgan [name], Morgan [name]                 ACCOUNT NO.:  [id number]   MEDICAL RECORD NO.:  [id number]          PATIENT TYPE:  INP   LOCATION:  1609                         FACILITY:  [facility]   PHYSICIAN:  Ollen Gross, M.D.    DATE OF BIRTH:  1941-07-05   DATE OF ADMISSION:  09/17/2008  DATE OF DISCHARGE:  09/20/2008                               DISCHARGE SUMMARY   ADMITTING DIAGNOSES:  1. Osteoarthritis, right hip.  2. Mitral valve prolapse.  3. Hyperlipidemia.  4. History of colonic polyps.  5. Degenerative joint disease.  6. Childhood illnesses of measles and mumps.  7. A history of incisional infection following cesarean section.   DISCHARGE DIAGNOSES:  1. Osteoarthritis, right knee, status post right total hip replacement      arthroplasty.  2. Postop acute blood loss anemia, did not require transfusion.  3. Mitral valve prolapse.  4. Hyperlipidemia.  5. History of colonic polyps.  6. Degenerative joint disease.  7. Childhood illnesses of measles and mumps.  8. A history of incisional infection following cesarean section.   PROCEDURE:  September 17, 2008, right total hip.  Surgeon, Dr. Lequita Halt.  Assistant, Avel Peace P.A.-C.  Anesthesia, general.   CONSULTS:  None.   BRIEF HISTORY:  Ms. Chumley is a 70 year old female with end-stage  arthritis of the right hip, progressive worsening pain and dysfunction,  failed nonoperative management, now presents for a right total hip  replacement arthroplasty.   LABORATORY DATA:  Preop CBC showed hemoglobin of 12.8, hematocrit of  37.9, white cell count 2.6, platelets 236.  Postop hemoglobin 9.9, drift  down to 9.6.  Last noted H and H 9.3 and 26.8.  PT/PTT 13.3 and 32  respectively.  INR 1.0.  Serial pro times followed per Coumadin  protocol.  Last noted PT/INR 21.3 and 1.7.  Chem panel on admission had  a slightly elevated glucose of 124, total protein low at 5.6, albumin  low at 3.3.  Remaining Chem panel within normal limits.  Serial  BMETs  were followed.  Electrolytes remained within normal limits.  Preop UA  showed small hemoglobin, a few bacteria, 0 to 2 white, 0 to 2 red,  otherwise negative.  Blood group type O+.   EKG, September 11, 2008, normal sinus rhythm, normal EKG, confirmed by Dr.  Armanda Magic.   Hip films x-ray, September 11, 2008, advanced osteoarthritic changes  involving the right hip.  Postop right hip film, September 17, 2008,  expected postop appearance of right total hip arthroplasty.   HOSPITAL COURSE:  Patient admitted to [facility] [place], taken to OR,  and underwent above-said procedure without complication.  Patient  tolerated procedure well.  Later transferred to recovery room on  orthopedic floor.  Started on PCA and p.o. analgesics.  Had a little bit  of low pressure on the morning of day 1.  She was given gentle fluids,  had decent output though hemoglobin was a little low at 9.9 so we  started her on iron.  Given 24 hours postop IV antibiotics.  Started on  Coumadin for DVT prophylaxis.  Started getting up out of bed on day one,  did a little stand pivot.  By day 2, was doing a little bit more  therapy.  Pain was under pretty good control.  Dressing change, incision  looked good.  Discontinued the fluids.  Hep-Lock b.i.d.  Hemoglobin was  9.6, stable, asymptomatic.  Electrolytes were stable.  Continued to  progress well with physical therapy, started meeting her goals, and by  the morning of day 3 patient was doing well.  Did 2 more sessions of  therapy to complete her therapy goals and was discharged home.   DISCHARGE PLAN:  1. Patient was discharged home on September 20, 2008.  2. Discharge diagnoses:  Please see above.  3. Discharge meds:  Percocet, Robaxin, Nu-Iron, and Coumadin.   DIET:  Low-cholesterol diet.   FOLLOWUP:  Two weeks.   ACTIVITY:  Partial weightbearing, 25% to 50%, right leg.  Total hip  precautions and hip protocol.  May start showering.  Do not submerge the   incision under water.   DISPOSITION:  Home.   CONDITION UPON DISCHARGE:  Improved.      Alexzandrew L. Perkins, P.A.C.      Ollen Gross, M.D.  Electronically Signed    ALP/MEDQ  D:  10/25/2008  T:  10/25/2008  Job:  [id number]   cc:   Ollen Gross, M.D.  Fax: [phone]   Lonzo Cloud. Kriste Basque, MD  [address]  [place]  [place] [address]

## 2011-01-02 NOTE — Op Note (Signed)
[name], [name]                 ACCOUNT NO.:  [id number]   MEDICAL RECORD NO.:  [id number]          PATIENT TYPE:  AMB   LOCATION:  [facility]                         FACILITY:  [facility]   PHYSICIAN:  Marlowe Kays, M.D.  DATE OF BIRTH:  08-20-1940   DATE OF PROCEDURE:  10/28/2004  DATE OF DISCHARGE:                                 OPERATIVE REPORT   PREOPERATIVE DIAGNOSES:  1.  Painful soft corn outer aspect left fourth toe.  2.  Suspected giant cell tumor, right thumb.   POSTOPERATIVE DIAGNOSES:  1.  Painful soft corn outer aspect left fourth toe.  2.  Suspected giant cell tumor, right thumb.   OPERATION:  1.  Partial proximal phalangectomies left fourth and fifth toes.  2.  Excision of suspected giant cell tumor, right thumb.   SURGEON:  Marlowe Kays, M.D.   ASSISTANT:  Nurse.   ANESTHESIA:  General.   PATHOLOGY AND JUSTIFICATION FOR PROCEDURE:  She has had a progressive  growing thumb mass over a number of months with an MRI demonstrating a solid  tumor most consistent with a giant cell tumor and this is the appearance it  had at surgery, see additional discussion under operation. She also had a  painful soft corn on the outer aspect of the proximal phalanx of the fourth  toe where the flare of the condyle was abutting against a similar area on  the little toe.   DESCRIPTION OF PROCEDURE:  Satisfactory general anesthesia, pneumatic  tourniquet for left lower extremity and right upper extremity.  I first  esmarched out the left lower extremity in nonsterile fashion and we prepped  the left foot and ankle with Duraprep, draped in a sterile field. I made a  dorsal latera incision over the area of the corn on the fourth toe and a  dorsal median incision over the abutting bone on the little toe. With  subperiosteal dissection, I exposed the condylar flares and resected both  with small rongeurs. The wound was reirrigated with sterile saline and the  toes blocked with 0.5%  plain Marcaine. The periosteal complex was closed  with interrupted 4-0 Vicryl and skin with interrupted 4-0 nylon mattress  sutures. Betadine Adaptic dry sterile dressing were applied. The tourniquet  was released. We then separately esmarched out the right upper extremity and  prepped the hand and wrist with duraprep, draped in a sterile field. The  mass was mainly on the volar ulnar side at the IP joint but extending up  onto the dorsum. I started out by making a horizontal V type incision with  the base on the ulnar side. The digital nerve on the radial side identified  and protected. I then began dissecting out the mass and working proximally,  on the ulnar side found the digital nerve in this location as well and I  protected it isolating out the tumor from around it. For the most part, the  tumor was able to be shelled out on the flexor surface but it then became  apparent that it was firmly attached under the dorsal surface as  well and I  prepared for this and made a separate incision on the dorsal ulnar side of  the thumb and dissected out the tumor at this location from the extensor  tendon and after freeing it up was able to push this dumbbell portion  through into the volar wound.  I then under direct visualization protecting  the digital nerve kept dissecting out the giant cell tumor until it had a  fibrous band at its base. I used magnifying loops, this did not have the  appearance of nerve and was directly attaching the tumor and had to be cut  at this point which allowed me to bring out the mass.  It was sent to  pathology. I then irrigated the wound well and inspected it. There did not  appear to be any remnant of tumor left behind. I released the tourniquet,  several liters were coagulated with bipolar cautery. I blocked the thumb  with 0.5% plain Marcaine as well and closed the two wounds with a  combination of simple and mattress 4-0 nylon. Betadine Adaptic dry sterile   dressing were applied. She tolerated the procedure well and at the time of  this dictation was taken to the recovery room in satisfactory condition with  no known complications.      JA/MEDQ  D:  10/28/2004  T:  10/28/2004  Job:  [id number]

## 2011-05-19 ENCOUNTER — Ambulatory Visit (INDEPENDENT_AMBULATORY_CARE_PROVIDER_SITE_OTHER): Payer: Self-pay

## 2011-05-19 DIAGNOSIS — Z23 Encounter for immunization: Secondary | ICD-10-CM

## 2011-11-23 ENCOUNTER — Encounter: Payer: Self-pay | Admitting: Pulmonary Disease

## 2011-11-23 ENCOUNTER — Ambulatory Visit (INDEPENDENT_AMBULATORY_CARE_PROVIDER_SITE_OTHER): Payer: Medicare Other | Admitting: Pulmonary Disease

## 2011-11-23 ENCOUNTER — Other Ambulatory Visit (INDEPENDENT_AMBULATORY_CARE_PROVIDER_SITE_OTHER): Payer: Medicare Other

## 2011-11-23 ENCOUNTER — Ambulatory Visit (INDEPENDENT_AMBULATORY_CARE_PROVIDER_SITE_OTHER)
Admission: RE | Admit: 2011-11-23 | Discharge: 2011-11-23 | Disposition: A | Discharge status: Home / Self Care | Payer: Medicare Other | Source: Ambulatory Visit | Attending: Pulmonary Disease | Admitting: Pulmonary Disease

## 2011-11-23 VITALS — BP 122/84 | HR 60 | Temp 97.0°F | Ht 66.5 in | Wt 188.8 lb

## 2011-11-23 DIAGNOSIS — M949 Disorder of cartilage, unspecified: Secondary | ICD-10-CM

## 2011-11-23 DIAGNOSIS — M899 Disorder of bone, unspecified: Secondary | ICD-10-CM

## 2011-11-23 DIAGNOSIS — F411 Generalized anxiety disorder: Secondary | ICD-10-CM

## 2011-11-23 DIAGNOSIS — D126 Benign neoplasm of colon, unspecified: Secondary | ICD-10-CM

## 2011-11-23 DIAGNOSIS — M1990 Unspecified osteoarthritis, unspecified site: Secondary | ICD-10-CM

## 2011-11-23 DIAGNOSIS — E785 Hyperlipidemia, unspecified: Secondary | ICD-10-CM

## 2011-11-23 DIAGNOSIS — I872 Venous insufficiency (chronic) (peripheral): Secondary | ICD-10-CM

## 2011-11-23 LAB — LIPID PANEL
Cholesterol: 231 mg/dL — ABNORMAL HIGH (ref 0–200)
Total CHOL/HDL Ratio: 4
Triglycerides: 64 mg/dL (ref 0.0–149.0)
VLDL: 12.8 mg/dL (ref 0.0–40.0)

## 2011-11-23 LAB — BASIC METABOLIC PANEL
CO2: 28 mEq/L (ref 19–32)
Calcium: 9.4 mg/dL (ref 8.4–10.5)
Creatinine, Ser: 1 mg/dL (ref 0.4–1.2)

## 2011-11-23 LAB — CBC WITH DIFFERENTIAL/PLATELET
Basophils Relative: 1.1 % (ref 0.0–3.0)
Eosinophils Absolute: 0.2 10*3/uL (ref 0.0–0.7)
Eosinophils Relative: 8.8 % — ABNORMAL HIGH (ref 0.0–5.0)
Hemoglobin: 13.9 g/dL (ref 12.0–15.0)
Lymphocytes Relative: 46.5 % — ABNORMAL HIGH (ref 12.0–46.0)
MCHC: 33.7 g/dL (ref 30.0–36.0)
Monocytes Relative: 8.9 % (ref 3.0–12.0)
Neutro Abs: 1 10*3/uL — ABNORMAL LOW (ref 1.4–7.7)
RBC: 4.55 Mil/uL (ref 3.87–5.11)
WBC: 2.8 10*3/uL — ABNORMAL LOW (ref 4.5–10.5)

## 2011-11-23 LAB — URINALYSIS, ROUTINE W REFLEX MICROSCOPIC
Nitrite: NEGATIVE
Specific Gravity, Urine: 1.01 (ref 1.000–1.030)
Total Protein, Urine: NEGATIVE
pH: 6 (ref 5.0–8.0)

## 2011-11-23 LAB — HEPATIC FUNCTION PANEL
Bilirubin, Direct: 0.1 mg/dL (ref 0.0–0.3)
Total Protein: 6.5 g/dL (ref 6.0–8.3)

## 2011-11-23 MED ORDER — ALENDRONATE SODIUM 70 MG PO TABS: 70.0000 mg | ORAL_TABLET | ORAL | Status: DC

## 2011-11-23 NOTE — Patient Instructions (Signed)
Today we updated your med list in our EPIC system...    Continue your current medications the same...   Please re-statrt the ALENDRONATE 70mg - one tab weekly as directed...    You should also take Calcium, a Women's Formula Multivit, & extra Vit D daily...  Today we did your needed CXR & FASTING blood work...    We will call you w/ the results in a few days...  Call fora nay questions...  Let's plan a follow up visit in 1 yr, sooner if needed for any problems.Marland KitchenMarland Kitchen

## 2011-11-23 NOTE — Progress Notes (Signed)
Subjective:     Patient ID: Morgan [name], female   DOB: [birth year], 71 y.o.   MRN: [id number]  HPI 71 y/o WF here for a follow up visit...   ~  Jun09:  she was last seen 7/07- her husband, Trannie Bardales died last yr w/ complic of DM... she states that she has been doing well and has no new complaints or concerns... she was prev on Actonel for Osteopenia but stopped this med due to bone pain and hurting all over (she states that she feels better off this Rx)... she has several medical issues as noted below:  ~  April 17, 2010:  she had right THR 2/10 by DrAlusio... she was adm 9/10 for ruptured appendix w/ laparoscopic surg DrThompson- appendectomy & drainage of abscess... she had f/u w/ GI- DrStark 3/11 w/ colonoscopy showing several 3-78mm polyps that were tubular adenomas on bx, f/u planned 62yrs... she has hx ?MVP- denies CP, palpit, SOB;  hx signif elevation in Chol but she refuses Statin Rx "I'm on FishOil" & refuses FLP recheck!;  notes under incr stress w/ father's illness but does not want meds... OK Flu vaccine & PNEUMOVAX today (age 18).  ~  November 23, 2011:  78mo ROV & Morgan [name] is on no prescription meds just ASA, FishOil, Calcium, MVI, VitD on her list... States she feels fine "I feel better than I did at 48"... Her CC was tinnitus & we discussed white noise blocking... See prob list below>> CXR 4/13 showed normal heart size, sl calcif of Aolungs clear w/ calcif granuloma in RLL, NAD... LABS 4/13:  FLP- not at goal w/ LDL 158;  Chems= ok;  CBC- ok x wbc=2.8;  TSH=3.83;  VitD=23;  UA- clear   PROBLEM LIST:     PROBLEM LIST UPDATED 11/23/11 >>  ALLERGY (ICD-995.3) - no recent problems... uses OTC meds as needed.  ? of MITRAL VALVE PROLAPSE (ICD-424.0) - long hx of intermittent palpit and occas sharp CP... prev exams w/ intermittent MSC heard... never required 2DEcho or other eval... currently asymptomatic without CP, palpit, dizziness, SOB, etc...  VENOUS INSUFFICIENCY (ICD-459.81) - mild VI  changes and no signif edema... she knows to avoid sodium, elevate legs, wear support hose, etc...  HYPERLIPIDEMIA (ICD-272.4) - signif elevation of Chol in the past... intol to Lipitor w/ bone pain and hurting all over... refused to try other statins, or other Chol meds, preferring to attempt control on diet alone... she is not fasting today and does not wish to return for f/u FLP or other labs... ~  FLP 5/05 showed TChol 259, TG 46, HDL 64, LDL 182... she tried Lipitor, then stopped... ~  FLP 7/07 showed TChol 230, TG 54, HDL 50, LDL 187... she refused Simvastatin trial... ~  she has declines fasting blood work since Jul07. ~  FLP 4/13 on diet alone showed TChol 231, TG 64, HDL 63, LDL 158... rec referral to Lipid Clinic.  COLONIC POLYPS (ICD-211.3) - colonoscopy 2/06 by DrStark showed 2mm polyps w/ adenomatous and hyperplastic changes...   ~  she was adm 9/10 for ruptured appendix w/ laparoscopic surg DrThompson- appendectomy & drainage of abscess ~  CTAbd 9/10 showed diffuse hepatic steatosis, modHH, left hepatic cyst, sm bilat renal cysts, acute appendicitis... ~  f/u colonoscopy done 3/11 w/ several 3-68mm polyps removed- path= tubular adenoma & f/u planned 25yrs.  HEMATURIA, HX OF (ICD-V13.09) - eval 2003 by DrTannenbaum w/ neg sonar and cysto...  DEGENERATIVE JOINT DISEASE (ICD-715.90) - s/p right  hand surgery 2006 by DrAplington to remove some benign nodules... ~  she has right THR 2/10 by DrAlusio... ~  4/13:  She states that all her pains resolved after her right THR "I don't have no pain nowhere"; notes small knot over the PIP joint of her left index finger  OSTEOPENIA (ICD-733.90) - BMD 4/09 w/ TScores -1.5 in sp, & -2.1 in hip> she takes ca++, MVI, VitD... she refuses bisphosphonate therapy... ~  Repeat BMD 9/11 showed TScores -1.3 in Spine & -2.3 in left [facility] (she had right THR 2/10); advised to restart ALENDRONATE 70mg /wk & continue Ca/ MVI/ VitD... ~  Labs 4/13 showed VitD level  = 23... She is rec to take 1-2000u Vit D daily as OTC supplement...  ANXIETY - c/o incr stress w/ father's illness... she is offered Rebeca Allegra Rx but she declines.  DERM = DrHall w/ mult SK's and actinic areas treated in the past... Basal cell skin cancer removed from right ear by drRosen in 2007...  HEALTH MAINTENANCE: ~  GI:  Followed by DrStark w/ last colonoscopy 3/11, removed several sm tubular adenoma & f/u due in 5 yrs... ~  GYN:   ~  Immunuz: she gets the yearly Flu vaccines; she had PNEUMOVAX 9/11 here...   Past Surgical History  Procedure Date  . Right hand surgery 2003    Dr. Simonne Come  . Right ear surgery 2007    Dr. Algie Coffer cancer  . Right thr 09/2008    Dr. Despina Hick  . Appendectomy & drainage of abscess 04/2009    Dr. Janee Morn    Outpatient Encounter Prescriptions as of 11/23/2011  Medication Sig Dispense Refill  . aspirin 81 MG tablet Take 81 mg by mouth daily.      . Calcium Carbonate-Vitamin D (CALTRATE 600+D) 600-400 MG-UNIT per tablet Take 1 tablet by mouth daily.      . cholecalciferol (VITAMIN D) 1000 UNITS tablet Take 1,000 Units by mouth daily.      . fish oil-omega-3 fatty acids 1000 MG capsule Take 2 g by mouth daily.      Marland Kitchen DISCONTD: alendronate (FOSAMAX) 70 MG tablet Take 70 mg by mouth every 7 (seven) days. Take with a full glass of water on an empty stomach.      . DISCONTD: Multiple Vitamins-Minerals (MULTIVITAMIN & MINERAL PO) Take 1 tablet by mouth daily.        Allergies  Allergen Reactions  . Atorvastatin     REACTION: pt states INTOL to Lipitor w/ constipation  . Penicillins     REACTION: hives    Current Medications, Allergies, Past Medical History, Past Surgical History, Family History, and Social History were reviewed in [employer] record.   Review of Systems    The patient denies fever, chills, sweats, anorexia, fatigue, weakness, malaise, weight loss, sleep disorder, blurring, diplopia, eye irritation, eye  discharge, vision loss, eye pain, photophobia, earache, ear discharge, tinnitus, decreased hearing, nasal congestion, nosebleeds, sore throat, hoarseness, chest pain, palpitations, syncope, dyspnea on exertion, orthopnea, PND, peripheral edema, cough, dyspnea at rest, excessive sputum, hemoptysis, wheezing, pleurisy, nausea, vomiting, diarrhea, constipation, change in bowel habits, abdominal pain, melena, hematochezia, jaundice, gas/bloating, indigestion/heartburn, dysphagia, odynophagia, dysuria, hematuria, urinary frequency, urinary hesitancy, nocturia, incontinence, back pain, joint pain, joint swelling, muscle cramps, muscle weakness, stiffness, arthritis, sciatica, restless legs, leg pain at night, leg pain with exertion, rash, itching, dryness, suspicious lesions, paralysis, paresthesias, seizures, tremors, vertigo, transient blindness, frequent falls, frequent headaches, difficulty walking, depression, anxiety, memory loss,  confusion, cold intolerance, heat intolerance, polydipsia, polyphagia, polyuria, unusual weight change, abnormal bruising, bleeding, enlarged lymph nodes, urticaria, allergic rash, hay fever, and recurrent infections.     Objective:   Physical Exam      WD, WN, 71 y/o WF in NAD... GENERAL:  Alert & oriented; pleasant & cooperative... HEENT:  [place]/AT, EOM-wnl, PERRLA, EACs-clear, TMs-wnl, NOSE-clear, THROAT-clear & wnl, dentures... NECK:  Supple w/ full ROM; no JVD; normal carotid impulses w/o bruits; palp thyroid w/o nodules ;  no lymphadenopathy. CHEST:  Clear to P & A; without wheezes/ rales/ or rhonchi. HEART:  Regular Rhythm; without murmurs/ rubs/ or gallops. ABDOMEN:  Soft & nontender; normal bowel sounds; no organomegaly or masses detected. EXT: without deformities, mod arthritic changes; no varicose veins/ +venous insuffic/ no edema. NEURO:  CN's intact; no focal neuro deficits... DERM:  No lesions noted; no rash etc...  RADIOLOGY DATA:  Reviewed in the EPIC EMR &  discussed w/ the patient...  LABORATORY DATA:  Reviewed in the EPIC EMR & discussed w/ the patient...   Assessment:     27mo ROV & review:  >60 min spent w/ pt reviewing hx, updating prob list, & in counseling for her mult problems...  Hx MVP>  Hx intermittent CP & palpit but none recently & no Rx required...  Venous Insuffic>  She knows to elim salt, elev legs, wear support hose...  CHOL>  Her numbers are not at goals & she has refused statin therapy; prev even refused f/u FLPs but capitulated 4/13 & LDL=158; rec Lipid Clinic...  GI>  SmHH, hepatic steatosis, colon polyps>  We discussed diet, elev HOB, OTC PPI as needed, & she is up to date on colonoscopy per DrStark...  GU>  Neg hematuria work up in 2000 and no known recurrence...  DJD>  She is s/o right THR by DrAlusio, states all of her pains are gone; has sm nodule left index finger 7 advised Ortho hand review- she will call...  Osteopenia & Vit D Defic>  She needs to restart the ALENDRONATE & we wrote this Rx; Vit Dis low as well & advised oral OTC supplement ~2000u daily...  Anxiety>  There has been much stress in her life; she declines anxiolytic Rx...     Plan:     Patient's Medications  New Prescriptions   No medications on file  Previous Medications   ASPIRIN 81 MG TABLET    Take 81 mg by mouth daily.   CALCIUM CARBONATE-VITAMIN D (CALTRATE 600+D) 600-400 MG-UNIT PER TABLET    Take 1 tablet by mouth daily.   CHOLECALCIFEROL (VITAMIN D) 1000 UNITS TABLET    Take 1,000 Units by mouth daily.   FISH OIL-OMEGA-3 FATTY ACIDS 1000 MG CAPSULE    Take 2 g by mouth daily.  Modified Medications   Modified Medication Previous Medication   ALENDRONATE (FOSAMAX) 70 MG TABLET alendronate (FOSAMAX) 70 MG tablet      Take 1 tablet (70 mg total) by mouth every 7 (seven) days. Take with a full glass of water on an empty stomach.    Take 70 mg by mouth every 7 (seven) days. Take with a full glass of water on an empty stomach.    Discontinued Medications   MULTIPLE VITAMINS-MINERALS (MULTIVITAMIN & MINERAL PO)    Take 1 tablet by mouth daily.

## 2011-11-26 ENCOUNTER — Other Ambulatory Visit: Payer: Self-pay | Admitting: Pulmonary Disease

## 2011-11-26 DIAGNOSIS — E785 Hyperlipidemia, unspecified: Secondary | ICD-10-CM

## 2011-11-30 ENCOUNTER — Encounter: Payer: Self-pay | Admitting: Pulmonary Disease

## 2012-01-05 ENCOUNTER — Ambulatory Visit (INDEPENDENT_AMBULATORY_CARE_PROVIDER_SITE_OTHER): Payer: Medicare Other | Admitting: Pharmacist

## 2012-01-05 VITALS — BP 134/76 | Wt 187.2 lb

## 2012-01-05 DIAGNOSIS — E785 Hyperlipidemia, unspecified: Secondary | ICD-10-CM

## 2012-01-05 NOTE — Patient Instructions (Signed)
It was nice to meet you today.  Try to continue increasing the fiber you get in your diet.  This comes from vegetables such as turnip greens, broccoli, carrots, cauliflower, etc.  You can also try fresh fruits such as apples.   Continue to exercise on a regular basis  Recheck labs in 6 months.

## 2012-01-06 NOTE — Assessment & Plan Note (Addendum)
TC 231 (goal<200), TG 64 (goal<150), HDL 62 (goal>45) and LDL 158 (soft goal<130, hard goal<160).  LFTs are WNL.  [place] 10-year risk- 4% Since pt is fairly controled and does not like to take prescription medications, will not add a medication at this time.  Will focus on lifestyle improvement.  Encouraged patient to increase fiber in diet through vegetables and fruits.  Encouraged increased exercise.  Follow-up with patient in 6 months.

## 2012-01-06 NOTE — Progress Notes (Signed)
HPI: Patient is 70yo WF presenting to the lipid clinic today for initial consult.  Patient is currently treated with fish oil 1g daily.  Has tried a trial of Lipitor and did not tolerated due to leg and joint pain and had no interest in a trial of simvastatin.  Patient expresses concerns over taking prescription medications and has trouble remembering to take meds. Patient has never had cardiovascular problems but has a significant family history including: mother had MI at 74, dad had CABG in late 82s and sister had CABG prior to her 56s. Patient has an additional risk factor with her age and a negative risk factor due to HDL of 62.   Diet: Patient reports seldom ice cream or pie.  Breakfast: Patient reports eating no breakfast with coffee and creamer, will sometimes have 1/2 a nutty butty package  10-11am: cheese and crackers or peanut butter crackers and a soft drink  1pm: Salad with club crackers, a sandwich or hamburger with minimal fries.  Drinks water of half-half tea  Dinner: Eats with sister and usually drinks milk or water.  Has mac and cheese, green beans, turnip greens and pinto beans.  Doesn't always eat meat every day, but may sometimes have ham or eggs and ham.    Exercise: Walks about 1/2 mile daily and does chair exercises that focuses on upper body.   Current Outpatient Prescriptions  Medication Sig Dispense Refill  . alendronate (FOSAMAX) 70 MG tablet Take 1 tablet (70 mg total) by mouth every 7 (seven) days. Take with a full glass of water on an empty stomach.  4 tablet  11  . aspirin 81 MG tablet Take 81 mg by mouth daily.      . Calcium Carbonate-Vitamin D (CALTRATE 600+D) 600-400 MG-UNIT per tablet Take 1 tablet by mouth daily.      . cholecalciferol (VITAMIN D) 1000 UNITS tablet Take 1,000 Units by mouth daily.      . fish oil-omega-3 fatty acids 1000 MG capsule Take 2 g by mouth daily.        Allergies  Allergen Reactions  . Atorvastatin     REACTION: pt states INTOL to  Lipitor w/ constipation  . Penicillins     REACTION: hives

## 2012-06-08 ENCOUNTER — Ambulatory Visit (INDEPENDENT_AMBULATORY_CARE_PROVIDER_SITE_OTHER): Payer: Medicare Other

## 2012-06-08 DIAGNOSIS — Z23 Encounter for immunization: Secondary | ICD-10-CM

## 2012-06-21 ENCOUNTER — Other Ambulatory Visit: Payer: Medicare Other

## 2012-06-28 ENCOUNTER — Ambulatory Visit: Payer: Medicare Other | Admitting: Pharmacist

## 2012-12-14 ENCOUNTER — Ambulatory Visit (INDEPENDENT_AMBULATORY_CARE_PROVIDER_SITE_OTHER): Payer: Medicare Other | Admitting: Pulmonary Disease

## 2012-12-14 ENCOUNTER — Encounter: Payer: Self-pay | Admitting: Pulmonary Disease

## 2012-12-14 VITALS — BP 120/70 | HR 71 | Temp 98.2°F | Ht 66.5 in | Wt 188.2 lb

## 2012-12-14 DIAGNOSIS — I872 Venous insufficiency (chronic) (peripheral): Secondary | ICD-10-CM

## 2012-12-14 DIAGNOSIS — M949 Disorder of cartilage, unspecified: Secondary | ICD-10-CM

## 2012-12-14 DIAGNOSIS — E785 Hyperlipidemia, unspecified: Secondary | ICD-10-CM

## 2012-12-14 DIAGNOSIS — M1990 Unspecified osteoarthritis, unspecified site: Secondary | ICD-10-CM

## 2012-12-14 DIAGNOSIS — M899 Disorder of bone, unspecified: Secondary | ICD-10-CM

## 2012-12-14 DIAGNOSIS — D126 Benign neoplasm of colon, unspecified: Secondary | ICD-10-CM

## 2012-12-14 DIAGNOSIS — F411 Generalized anxiety disorder: Secondary | ICD-10-CM

## 2012-12-14 MED ORDER — CLOTRIMAZOLE-BETAMETHASONE 1-0.05 % EX CREA: TOPICAL_CREAM | Freq: Two times a day (BID) | CUTANEOUS | Status: DC

## 2012-12-14 NOTE — Progress Notes (Signed)
Subjective:     Patient ID: Morgan [name], female   DOB: 1941/01/25, 72 y.o.   MRN: [id number]  HPI 72 y/o WF here for a follow up visit...   ~  April 17, 2010:  she had right THR 2/10 by DrAlusio... she was adm 9/10 for ruptured appendix w/ laparoscopic surg DrThompson- appendectomy & drainage of abscess... she had f/u w/ GI- DrStark 3/11 w/ colonoscopy showing several 3-76mm polyps that were tubular adenomas on bx, f/u planned 58yrs... she has hx ?MVP- denies CP, palpit, SOB;  hx signif elevation in Chol but she refuses Statin Rx "I'm on FishOil" & refuses FLP recheck!;  notes under incr stress w/ father's illness but does not want meds... OK Flu vaccine & PNEUMOVAX today (age 25).  ~  November 23, 2011:  10mo ROV & Demecia is on no prescription meds just ASA, FishOil, Calcium, MVI, VitD on her list... States she feels fine "I feel better than I did at 48"... Her CC was tinnitus & we discussed white noise blocking... See prob list below>> CXR 4/13 showed normal heart size, sl calcif of Aolungs clear w/ calcif granuloma in RLL, NAD... LABS 4/13:  FLP- not at goal w/ LDL 158;  Chems= ok;  CBC- ok x wbc=2.8;  TSH=3.83;  VitD=23;  UA- clear  ~  December 14, 2012:  Yearly ROV & Morgan [name] returns ? for refill of Alendronate; she is w/o complaints or concerns and doesn't want XRays, f/u BMD, Labs, etc... She has a mild rash c/w tinea corporis & wants topical Rx- try Lotrisone & refer to Derm [facility]) if not resolved on this med...     ?HxMVP> hx intermit msc on exams w/ occas sharpCP & palpit; never had 2DEcho, denies current symptoms, states she is active about the house but not exercising...    Ven Insuffic> mils changes on PE; she knows to avoid salt, elev legs, wear support hose...    Hyperlipid> on diet alone; long hx signif elev of TChol & LDL; intol to Lipitor in past, refused to try other statins; referred to [facility] in 2013 & she went once & refused to return; "I've read a lot about it and adjusted my  diet"- she refuses med rx & refuses f/u labs...     Hx colon polyps> last colonoscopy 3/11 by DrStark showed several tubular adenomas removed & f/u planned 4yrs...    Hx hematuria> eval by DrTannenbaum 2003 was neg; she denies recurrence or any GU symptoms; she declines f/u urinalysis...    DJD> she is s/p right THR 2/10 by DrAlusio & states that all her diffuse aches & pains resolved after this surg...    Osteopenia> last TScore -2.3 in left FemNeck on BMD 9/11 and she started on Alendronate 70mg /wk; it is time to recheck BMD but she declines & wants to stop the Bisphos Rx- "I'll do calcium, vits, & VitD" she says...    Vit D defic> Vit D level was 23 in 2013 & asked to start 1-2000u daily OTC supplement; she declines f/u blood work, but will continue the supplements...    Anxiety> much stress in her life, she declines anxiolytic rx... Fall risk & Depression screens are neg... We reviewed prob list, meds, xrays and labs> see below for updates >> she had the 2013 Flu vaccine 10/13... LABS 2014> pt declines to have f/u labs drawn...          PROBLEM LIST:     ALLERGY (ICD-995.3) - no  recent problems... uses OTC meds as needed.  ? of MITRAL VALVE PROLAPSE (ICD-424.0) - long hx of intermittent palpit and occas sharp CP... prev exams w/ intermittent MSC heard... never required 2DEcho or other eval... currently asymptomatic without CP, palpit, dizziness, SOB, etc...  VENOUS INSUFFICIENCY (ICD-459.81) - mild VI changes and no signif edema... she knows to avoid sodium, elevate legs, wear support hose, etc...  HYPERLIPIDEMIA (ICD-272.4) - signif elevation of Chol in the past... intol to Lipitor w/ bone pain and hurting all over... refused to try other statins, or other Chol meds, preferring to attempt control on diet alone... ~  FLP 5/05 showed TChol 259, TG 46, HDL 64, LDL 182... she tried Lipitor, then stopped... ~  FLP 7/07 showed TChol 230, TG 54, HDL 50, LDL 187... she refused Simvastatin  trial... ~  she has declines fasting blood work since Jul07. ~  FLP 4/13 on diet alone showed TChol 231, TG 64, HDL 63, LDL 158... rec referral to Lipid Clinic (she went once- they stressed lifestyle mod & she interpreted this as an endorsement of no meds- pt didn't ret) ~  4/14:  Pt refuses med rx for her lipid abnormality & she declines to have fasting blood work recheck- "I've read a lot about it and adjusted my diet" she says...  COLONIC POLYPS (ICD-211.3) - colonoscopy 2/06 by DrStark showed 2mm polyps w/ adenomatous and hyperplastic changes...   ~  she was adm 9/10 for ruptured appendix w/ laparoscopic surg DrThompson- appendectomy & drainage of abscess ~  CTAbd 9/10 showed diffuse hepatic steatosis, modHH, left hepatic cyst, sm bilat renal cysts, acute appendicitis... ~  f/u colonoscopy done 3/11 w/ several 3-60mm polyps removed- path= tubular adenoma & f/u planned 63yrs.  HEMATURIA, HX OF (ICD-V13.09) - eval 2003 by DrTannenbaum w/ neg sonar and cysto...  DEGENERATIVE JOINT DISEASE (ICD-715.90) - s/p right hand surgery 2006 by DrAplington to remove some benign nodules... ~  she has right THR 2/10 by DrAlusio... ~  4/13:  She states that all her pains resolved after her right THR "I don't have no pain nowhere"; notes small knot over the PIP joint of her left index finger  OSTEOPENIA (ICD-733.90) - BMD 4/09 w/ TScores -1.5 in sp, & -2.1 in hip> she takes ca++, MVI, VitD... she refuses bisphosphonate therapy... ~  Repeat BMD 9/11 showed TScores -1.3 in Spine & -2.3 in left [facility] (she had right THR 2/10); advised to restart ALENDRONATE 70mg /wk & continue Ca/ MVI/ VitD... ~  Labs 4/13 showed VitD level = 23... She is rec to take 1-2000u Vit D daily as OTC supplement... ~  4/14:  last TScore -2.3 in left FemNeck on BMD 9/11 and she started on Alendronate 70mg /wk; it is time to recheck BMD but she declines & wants to stop the Bisphos Rx- "I'll do calcium, vits, & VitD" she says  ANXIETY - c/o  incr stress w/ father's illness... she is offered Rebeca Allegra Rx but she declines.  DERM = DrHall w/ mult SK's and actinic areas treated in the past... Basal cell skin cancer removed from right ear by drRosen in 2007...  HEALTH MAINTENANCE: ~  GI:  Followed by DrStark w/ last colonoscopy 3/11, removed several sm tubular adenoma & f/u due in 5 yrs... ~  GYN:  She is reminded to get a gynecologist for her Pap smears, Mammograms & breast exams, etc... ~  Immunuz: she gets the yearly Flu vaccines; she had PNEUMOVAX 9/11 here...   Past Surgical History  Procedure  Laterality Date  . Right hand surgery  2003    Dr. Simonne Come  . Right ear surgery  2007    Dr. Algie Coffer cancer  . Right thr  09/2008    Dr. Despina Hick  . Appendectomy & drainage of abscess  04/2009    Dr. Janee Morn    Outpatient Encounter Prescriptions as of 12/14/2012  Medication Sig Dispense Refill  . alendronate (FOSAMAX) 70 MG tablet Take 1 tablet (70 mg total) by mouth every 7 (seven) days. Take with a full glass of water on an empty stomach.  4 tablet  11  . aspirin 81 MG tablet Take 81 mg by mouth daily.      . Calcium Carbonate-Vitamin D (CALTRATE 600+D) 600-400 MG-UNIT per tablet Take 1 tablet by mouth daily.      . cholecalciferol (VITAMIN D) 1000 UNITS tablet Take 1,000 Units by mouth daily.      . fish oil-omega-3 fatty acids 1000 MG capsule Take 2 g by mouth daily.       No facility-administered encounter medications on file as of 12/14/2012.    Allergies  Allergen Reactions  . Atorvastatin     REACTION: pt states INTOL to Lipitor w/ constipation  . Penicillins     REACTION: hives    Current Medications, Allergies, Past Medical History, Past Surgical History, Family History, and Social History were reviewed in [employer] record.   Review of Systems    The patient denies fever, chills, sweats, anorexia, fatigue, weakness, malaise, weight loss, sleep disorder, blurring, diplopia, eye  irritation, eye discharge, vision loss, eye pain, photophobia, earache, ear discharge, tinnitus, decreased hearing, nasal congestion, nosebleeds, sore throat, hoarseness, chest pain, palpitations, syncope, dyspnea on exertion, orthopnea, PND, peripheral edema, cough, dyspnea at rest, excessive sputum, hemoptysis, wheezing, pleurisy, nausea, vomiting, diarrhea, constipation, change in bowel habits, abdominal pain, melena, hematochezia, jaundice, gas/bloating, indigestion/heartburn, dysphagia, odynophagia, dysuria, hematuria, urinary frequency, urinary hesitancy, nocturia, incontinence, back pain, joint pain, joint swelling, muscle cramps, muscle weakness, stiffness, arthritis, sciatica, restless legs, leg pain at night, leg pain with exertion, rash, itching, dryness, suspicious lesions, paralysis, paresthesias, seizures, tremors, vertigo, transient blindness, frequent falls, frequent headaches, difficulty walking, depression, anxiety, memory loss, confusion, cold intolerance, heat intolerance, polydipsia, polyphagia, polyuria, unusual weight change, abnormal bruising, bleeding, enlarged lymph nodes, urticaria, allergic rash, hay fever, and recurrent infections.     Objective:   Physical Exam      WD, WN, 72 y/o WF in NAD... GENERAL:  Alert & oriented; pleasant & cooperative... HEENT:  Hobbs/AT, EOM-wnl, PERRLA, EACs-clear, TMs-wnl, NOSE-clear, THROAT-clear & wnl, dentures... NECK:  Supple w/ full ROM; no JVD; normal carotid impulses w/o bruits; palp thyroid w/o nodules ;  no lymphadenopathy. CHEST:  Clear to P & A; without wheezes/ rales/ or rhonchi. HEART:  Regular Rhythm; without murmurs/ rubs/ or gallops. ABDOMEN:  Soft & nontender; normal bowel sounds; no organomegaly or masses detected. EXT: without deformities, mod arthritic changes; no varicose veins/ +venous insuffic/ no edema. NEURO:  CN's intact; no focal neuro deficits... DERM:  No lesions noted; no rash etc...  RADIOLOGY DATA:  Reviewed in  the EPIC EMR & discussed w/ the patient...  LABORATORY DATA:  Reviewed in the EPIC EMR & discussed w/ the patient...   Assessment:      Hx MVP>  Hx intermittent CP & palpit but none recently & no Rx required...  Venous Insuffic>  She knows to elim salt, elev legs, wear support hose...  CHOL>  Her numbers are  not at goals & she has refused statin therapy; prev even refused f/u FLPs but capitulated 4/13 & LDL=158; went to Lipid Clinic one time & refuses f/u...  GI>  SmHH, hepatic steatosis, colon polyps>  We discussed diet, elev HOB, OTC PPI as needed, & she is up to date on colonoscopy per DrStark...  GU>  Neg hematuria work up in 2000 and no known recurrence, she declines f/u UA...  DJD>  She is s/o right THR by DrAlusio, states all of her pains are gone; has sm nodule left index finger & advised Ortho hand review- she will call...  Osteopenia & Vit D Defic>  She needs f/u BMD but declines & she wants to stop the Alendronate-  advised oral OTC supplements ~2000u daily...  Anxiety>  There has been much stress in her life; she declines anxiolytic Rx...     Plan:     Patient's Medications  New Prescriptions   CLOTRIMAZOLE-BETAMETHASONE (LOTRISONE) CREAM    Apply topically 2 (two) times daily.  Previous Medications   ASPIRIN 81 MG TABLET    Take 81 mg by mouth daily.   CALCIUM CARBONATE-VITAMIN D (CALTRATE 600+D) 600-400 MG-UNIT PER TABLET    Take 1 tablet by mouth daily.   CHOLECALCIFEROL (VITAMIN D) 1000 UNITS TABLET    Take 1,000 Units by mouth daily.   FISH OIL-OMEGA-3 FATTY ACIDS 1000 MG CAPSULE    Take 2 g by mouth daily.  Modified Medications   No medications on file  Discontinued Medications   ALENDRONATE (FOSAMAX) 70 MG TABLET    Take 1 tablet (70 mg total) by mouth every 7 (seven) days. Take with a full glass of water on an empty stomach.

## 2012-12-14 NOTE — Patient Instructions (Addendum)
Today we updated your med list in our EPIC system...    Continue your current supplements the same...  We decided to stop the Alendronate & treat your osteopenia w/ calcium, multivits, & vitamin d supplement, plus regular weight bearing exercises...  For your rash we rec trying the Lortisone cream & if not resolved then check in w/ your Dermatologist...  Call for any questions...  Let's plan a follow up visit in 42yr, sooner if needed for problems.Marland KitchenMarland Kitchen

## 2013-05-29 ENCOUNTER — Ambulatory Visit (INDEPENDENT_AMBULATORY_CARE_PROVIDER_SITE_OTHER): Payer: Medicare Other

## 2013-05-29 DIAGNOSIS — Z23 Encounter for immunization: Secondary | ICD-10-CM

## 2013-10-17 ENCOUNTER — Telehealth: Payer: Self-pay | Admitting: Pulmonary Disease

## 2013-10-17 DIAGNOSIS — M79641 Pain in right hand: Secondary | ICD-10-CM

## 2013-10-17 DIAGNOSIS — M79601 Pain in right arm: Secondary | ICD-10-CM

## 2013-10-17 NOTE — Telephone Encounter (Signed)
Per SN - Recommend ortho referral - [facility] - Dx: Hand and arm pain - Or offer ov with TP tomorrow

## 2013-10-17 NOTE — Telephone Encounter (Signed)
Spoke with patient-she states she noticed her Right hand at wrist and thumb hurting yesterday-no injuries or falls. She states she could pick up her coffee cup yesterday but through the night she has gotten worse. She could not even use her Right hand to pull up her pajamas. She has used ice and heat for about 20 minutes each time with only slight relief. Pt is able to bend her fingers but is sore and feels like its pulling her wrist too hard. Pt aware that SN nor TP have openings today.   SN please advise. Thanks.

## 2013-10-17 NOTE — Telephone Encounter (Signed)
Spoke with patient-she is aware of referral for Ortho at [facility] for hand and arm pain. She states she had hip surgery with Dr Maureen Ralphs back in 2010 and is okay with seeing him again if he works with hand and arm pain. Order placed to [facility] and patient will await a call from [facility] with date and time of OV.

## 2014-10-15 ENCOUNTER — Encounter: Payer: Self-pay | Admitting: Gastroenterology

## 2014-10-30 ENCOUNTER — Encounter: Payer: Self-pay | Admitting: Gastroenterology

## 2014-12-06 ENCOUNTER — Ambulatory Visit (AMBULATORY_SURGERY_CENTER): Payer: Medicare Other | Admitting: *Deleted

## 2014-12-06 VITALS — Ht 67.0 in | Wt 187.2 lb

## 2014-12-06 DIAGNOSIS — Z86010 Personal history of colonic polyps: Secondary | ICD-10-CM

## 2014-12-06 MED ORDER — NA SULFATE-K SULFATE-MG SULF 17.5-3.13-1.6 GM/177ML PO SOLN: ORAL | Status: DC

## 2014-12-06 NOTE — Progress Notes (Signed)
No allergies to eggs or soy. No problems with anesthesia.  Pt not given Emmi instructions for colonoscopy, no computer access  No oxygen use  No diet drug use

## 2014-12-20 ENCOUNTER — Ambulatory Visit (AMBULATORY_SURGERY_CENTER): Payer: Medicare Other | Admitting: Gastroenterology

## 2014-12-20 ENCOUNTER — Encounter: Payer: Self-pay | Admitting: Gastroenterology

## 2014-12-20 VITALS — BP 109/73 | HR 60 | Temp 97.9°F | Resp 23 | Ht 67.0 in | Wt 187.0 lb

## 2014-12-20 DIAGNOSIS — Z86010 Personal history of colonic polyps: Secondary | ICD-10-CM

## 2014-12-20 MED ORDER — SODIUM CHLORIDE 0.9 % IV SOLN: 500.0000 mL | INTRAVENOUS | Status: DC

## 2014-12-20 NOTE — Progress Notes (Signed)
Report to PACU, RN, vss, BBS= Clear.  

## 2014-12-20 NOTE — Op Note (Signed)
[facility]  [address]. [place], [address]   COLONOSCOPY PROCEDURE REPORT  PATIENT: Morgan [name], Morgan [name]  MR#: [id number] BIRTHDATE: 05-23-1941 , 47  yrs. old GENDER: female ENDOSCOPIST: Ladene Artist, MD, [facility] PROCEDURE DATE:  12/20/2014 PROCEDURE:   Colonoscopy, surveillance First Screening Colonoscopy - Avg.  risk and is 50 yrs.  old or older - No.  Prior Negative Screening - Now for repeat screening. N/A  History of Adenoma - Now for follow-up colonoscopy & has been > or = to 3 yrs.  Yes hx of adenoma.  Has been 3 or more years since last colonoscopy. ASA CLASS:   Class II INDICATIONS:Surveillance due to prior colonic neoplasia and PH Colon Adenoma. MEDICATIONS: Monitored anesthesia care and Propofol 140 mg IV DESCRIPTION OF PROCEDURE:   After the risks benefits and alternatives of the procedure were thoroughly explained, informed consent was obtained.  The digital rectal exam revealed no abnormalities of the rectum.   The LB [id number]  endoscope was introduced through the anus and advanced to the cecum, which was identified by both the appendix and ileocecal valve. No adverse events experienced.   The quality of the prep was excellent. (Suprep was used)  The instrument was then slowly withdrawn as the colon was fully examined.    COLON FINDINGS: There was moderate diverticulosis noted in the sigmoid colon with associated muscular hypertrophy.   The colonic mucosa appeared normal in the transverse colon, descending colon, rectum, at the hepatic flexure, cecum, ileocecal valve, and in the ascending colon.  Retroflexed views revealed no abnormalities. The time to cecum = 6.1 Withdrawal time = 8.2   The scope was withdrawn and the procedure completed. COMPLICATIONS: There were no immediate complications.  ENDOSCOPIC IMPRESSION: 1.   Moderate diverticulosis noted in the sigmoid colon 2.   The colonic otherwise appeared normal  RECOMMENDATIONS: 1.   High fiber diet with liberal fluid intake. 2.  Given your age, you will not need another colonoscopy for colon cancer screening or polyp surveillance.  These types of tests usually stop around the age 29.  eSigned:  Ladene Artist, MD, [facility] 12/20/2014 8:53 AM   cc: Velna Hatchet, MD

## 2014-12-20 NOTE — Patient Instructions (Signed)
YOU HAD AN ENDOSCOPIC PROCEDURE TODAY AT [facility]:   Refer to the procedure report that was given to you for any specific questions about what was found during the examination.  If the procedure report does not answer your questions, please call your gastroenterologist to clarify.  If you requested that your care partner not be given the details of your procedure findings, then the procedure report has been included in a sealed envelope for you to review at your convenience later.  YOU SHOULD EXPECT: Some feelings of bloating in the abdomen. Passage of more gas than usual.  Walking can help get rid of the air that was put into your GI tract during the procedure and reduce the bloating. If you had a lower endoscopy (such as a colonoscopy or flexible sigmoidoscopy) you may notice spotting of blood in your stool or on the toilet paper. If you underwent a bowel prep for your procedure, you may not have a normal bowel movement for a few days.  Please Note:  You might notice some irritation and congestion in your nose or some drainage.  This is from the oxygen used during your procedure.  There is no need for concern and it should clear up in a day or so.  SYMPTOMS TO REPORT IMMEDIATELY:   Following lower endoscopy (colonoscopy or flexible sigmoidoscopy):  Excessive amounts of blood in the stool  Significant tenderness or worsening of abdominal pains  Swelling of the abdomen that is new, acute  Fever of 100F or higher  For urgent or emergent issues, a gastroenterologist can be reached at any hour by calling [phone].  DIET: Your first meal following the procedure should be a small meal and then it is ok to progress to your normal diet. Heavy or fried foods are harder to digest and may make you feel nauseous or bloated.  Likewise, meals heavy in dairy and vegetables can increase bloating.  Drink plenty of fluids but you should avoid alcoholic beverages for 24 hours.  ACTIVITY:   You should plan to take it easy for the rest of today and you should NOT DRIVE or use heavy machinery until tomorrow (because of the sedation medicines used during the test).    FOLLOW UP: Our staff will call the number listed on your records the next business day following your procedure to check on you and address any questions or concerns that you may have regarding the information given to you following your procedure. If we do not reach you, we will leave a message.  However, if you are feeling well and you are not experiencing any problems, there is no need to return our call.  We will assume that you have returned to your regular daily activities without incident.  SIGNATURES/CONFIDENTIALITY: You and/or your care partner have signed paperwork which will be entered into your electronic medical record.  These signatures attest to the fact that that the information above on your After Visit Summary has been reviewed and is understood.  Full responsibility of the confidentiality of this discharge information lies with you and/or your care-partner.  Continue your normal medications  Please read over handouts about diverticulosis and high fiber diets

## 2014-12-21 ENCOUNTER — Telehealth: Payer: Self-pay | Admitting: *Deleted

## 2014-12-21 NOTE — Telephone Encounter (Signed)
  Follow up Call-  Call back number 12/20/2014  Post procedure Call Back phone  # [phone]  Permission to leave phone message Yes     Patient questions:  Do you have a fever, pain , or abdominal swelling? No. Pain Score  0 *  Have you tolerated food without any problems? Yes.    Have you been able to return to your normal activities? Yes.    Do you have any questions about your discharge instructions: Diet   No. Medications  No. Follow up visit  No.  Do you have questions or concerns about your Care? No.  Actions: * If pain score is 4 or above:0 No action needed, pain <4.

## 2015-10-07 DIAGNOSIS — M79645 Pain in left finger(s): Secondary | ICD-10-CM | POA: Diagnosis not present

## 2015-10-07 DIAGNOSIS — R2232 Localized swelling, mass and lump, left upper limb: Secondary | ICD-10-CM | POA: Diagnosis not present

## 2016-01-01 DIAGNOSIS — H2011 Chronic iridocyclitis, right eye: Secondary | ICD-10-CM | POA: Diagnosis not present

## 2016-01-01 DIAGNOSIS — H1820 Unspecified corneal edema: Secondary | ICD-10-CM | POA: Diagnosis not present

## 2016-01-01 DIAGNOSIS — Z0100 Encounter for examination of eyes and vision without abnormal findings: Secondary | ICD-10-CM | POA: Diagnosis not present

## 2016-01-01 DIAGNOSIS — H26492 Other secondary cataract, left eye: Secondary | ICD-10-CM | POA: Diagnosis not present

## 2016-01-09 DIAGNOSIS — H26492 Other secondary cataract, left eye: Secondary | ICD-10-CM | POA: Diagnosis not present

## 2016-03-10 DIAGNOSIS — N390 Urinary tract infection, site not specified: Secondary | ICD-10-CM | POA: Diagnosis not present

## 2016-03-10 DIAGNOSIS — E559 Vitamin D deficiency, unspecified: Secondary | ICD-10-CM | POA: Diagnosis not present

## 2016-03-10 DIAGNOSIS — E784 Other hyperlipidemia: Secondary | ICD-10-CM | POA: Diagnosis not present

## 2016-03-10 DIAGNOSIS — R8299 Other abnormal findings in urine: Secondary | ICD-10-CM | POA: Diagnosis not present

## 2016-03-11 DIAGNOSIS — R946 Abnormal results of thyroid function studies: Secondary | ICD-10-CM | POA: Diagnosis not present

## 2016-03-17 DIAGNOSIS — R3129 Other microscopic hematuria: Secondary | ICD-10-CM | POA: Diagnosis not present

## 2016-03-17 DIAGNOSIS — Z6829 Body mass index (BMI) 29.0-29.9, adult: Secondary | ICD-10-CM | POA: Diagnosis not present

## 2016-03-17 DIAGNOSIS — Z8249 Family history of ischemic heart disease and other diseases of the circulatory system: Secondary | ICD-10-CM | POA: Diagnosis not present

## 2016-03-17 DIAGNOSIS — M79605 Pain in left leg: Secondary | ICD-10-CM | POA: Diagnosis not present

## 2016-03-17 DIAGNOSIS — E559 Vitamin D deficiency, unspecified: Secondary | ICD-10-CM | POA: Diagnosis not present

## 2016-03-17 DIAGNOSIS — M859 Disorder of bone density and structure, unspecified: Secondary | ICD-10-CM | POA: Diagnosis not present

## 2016-03-17 DIAGNOSIS — Z0000 Encounter for general adult medical examination without abnormal findings: Secondary | ICD-10-CM | POA: Diagnosis not present

## 2016-03-17 DIAGNOSIS — F419 Anxiety disorder, unspecified: Secondary | ICD-10-CM | POA: Diagnosis not present

## 2016-03-17 DIAGNOSIS — Z1389 Encounter for screening for other disorder: Secondary | ICD-10-CM | POA: Diagnosis not present

## 2016-03-17 DIAGNOSIS — E784 Other hyperlipidemia: Secondary | ICD-10-CM | POA: Diagnosis not present

## 2016-05-16 DIAGNOSIS — Z23 Encounter for immunization: Secondary | ICD-10-CM | POA: Diagnosis not present

## 2016-06-22 DIAGNOSIS — H2011 Chronic iridocyclitis, right eye: Secondary | ICD-10-CM | POA: Diagnosis not present

## 2016-06-22 DIAGNOSIS — H1820 Unspecified corneal edema: Secondary | ICD-10-CM | POA: Diagnosis not present

## 2016-06-22 DIAGNOSIS — Z961 Presence of intraocular lens: Secondary | ICD-10-CM | POA: Diagnosis not present

## 2016-11-24 DIAGNOSIS — M859 Disorder of bone density and structure, unspecified: Secondary | ICD-10-CM | POA: Diagnosis not present

## 2016-12-29 DIAGNOSIS — L570 Actinic keratosis: Secondary | ICD-10-CM | POA: Diagnosis not present

## 2016-12-29 DIAGNOSIS — L821 Other seborrheic keratosis: Secondary | ICD-10-CM | POA: Diagnosis not present

## 2016-12-29 DIAGNOSIS — L818 Other specified disorders of pigmentation: Secondary | ICD-10-CM | POA: Diagnosis not present

## 2016-12-29 DIAGNOSIS — S80861A Insect bite (nonvenomous), right lower leg, initial encounter: Secondary | ICD-10-CM | POA: Diagnosis not present

## 2017-01-04 DIAGNOSIS — H1820 Unspecified corneal edema: Secondary | ICD-10-CM | POA: Diagnosis not present

## 2017-01-04 DIAGNOSIS — Z961 Presence of intraocular lens: Secondary | ICD-10-CM | POA: Diagnosis not present

## 2017-03-16 DIAGNOSIS — E559 Vitamin D deficiency, unspecified: Secondary | ICD-10-CM | POA: Diagnosis not present

## 2017-03-16 DIAGNOSIS — R946 Abnormal results of thyroid function studies: Secondary | ICD-10-CM | POA: Diagnosis not present

## 2017-03-16 DIAGNOSIS — R8299 Other abnormal findings in urine: Secondary | ICD-10-CM | POA: Diagnosis not present

## 2017-03-16 DIAGNOSIS — E784 Other hyperlipidemia: Secondary | ICD-10-CM | POA: Diagnosis not present

## 2017-03-23 DIAGNOSIS — Z0000 Encounter for general adult medical examination without abnormal findings: Secondary | ICD-10-CM | POA: Diagnosis not present

## 2017-03-23 DIAGNOSIS — M810 Age-related osteoporosis without current pathological fracture: Secondary | ICD-10-CM | POA: Diagnosis not present

## 2017-03-23 DIAGNOSIS — F418 Other specified anxiety disorders: Secondary | ICD-10-CM | POA: Diagnosis not present

## 2017-03-23 DIAGNOSIS — Z1212 Encounter for screening for malignant neoplasm of rectum: Secondary | ICD-10-CM | POA: Diagnosis not present

## 2017-03-23 DIAGNOSIS — R3129 Other microscopic hematuria: Secondary | ICD-10-CM | POA: Diagnosis not present

## 2017-06-12 DIAGNOSIS — Z23 Encounter for immunization: Secondary | ICD-10-CM | POA: Diagnosis not present

## 2017-10-04 DIAGNOSIS — H52223 Regular astigmatism, bilateral: Secondary | ICD-10-CM | POA: Diagnosis not present

## 2017-10-22 ENCOUNTER — Other Ambulatory Visit: Payer: Self-pay | Admitting: Surgery

## 2017-10-22 DIAGNOSIS — R2231 Localized swelling, mass and lump, right upper limb: Secondary | ICD-10-CM | POA: Diagnosis not present

## 2017-10-22 NOTE — Patient Instructions (Addendum)
Morgan [name]  10/22/2017   Your procedure is scheduled on: Thursday 10/28/2017  Report to [facility] Main  Entrance              Report to admitting at  0800 AM    Call this number if you have problems the morning of surgery [phone]    Remember: Do not eat food or drink liquids :After Midnight.     Take these medicines the morning of surgery with A SIP OF WATER: eye drops if needed                                You may not have any metal on your body including hair pins and              piercings  Do not wear jewelry, make-up, lotions, powders or perfumes, deodorant             Do not wear nail polish.  Do not shave  48 hours prior to surgery.              Men may shave face and neck.   Do not bring valuables to the hospital. [place].  Contacts, dentures or bridgework may not be worn into surgery.  Leave suitcase in the car. After surgery it may be brought to your room.     Patients discharged the day of surgery will not be allowed to drive home.  Name and phone number of your driver: son-Morgan [name] or daughter-in-law-Morgan [name]               Please read over the following fact sheets you were given: _____________________________________________________________________             [facility] - Preparing for Surgery Before surgery, you can play an important role.  Because skin is not sterile, your skin needs to be as free of germs as possible.  You can reduce the number of germs on your skin by washing with CHG (chlorahexidine gluconate) soap before surgery.  CHG is an antiseptic cleaner which kills germs and bonds with the skin to continue killing germs even after washing. Please DO NOT use if you have an allergy to CHG or antibacterial soaps.  If your skin becomes reddened/irritated stop using the CHG and inform your nurse when you arrive at Short Stay. Do not shave (including legs and underarms) for  at least 48 hours prior to the first CHG shower.  You may shave your face/neck. Please follow these instructions carefully:  1.  Shower with CHG Soap the night before surgery and the  morning of Surgery.  2.  If you choose to wash your hair, wash your hair first as usual with your  normal  shampoo.  3.  After you shampoo, rinse your hair and body thoroughly to remove the  shampoo.                           4.  Use CHG as you would any other liquid soap.  You can apply chg directly  to the skin and wash  Gently with a scrungie or clean washcloth.  5.  Apply the CHG Soap to your body ONLY FROM THE NECK DOWN.   Do not use on face/ open                           Wound or open sores. Avoid contact with eyes, ears mouth and genitals (private parts).                       Wash face,  Genitals (private parts) with your normal soap.             6.  Wash thoroughly, paying special attention to the area where your surgery  will be performed.  7.  Thoroughly rinse your body with warm water from the neck down.  8.  DO NOT shower/wash with your normal soap after using and rinsing off  the CHG Soap.                9.  Pat yourself dry with a clean towel.            10.  Wear clean pajamas.            11.  Place clean sheets on your bed the night of your first shower and do not  sleep with pets. Day of Surgery : Do not apply any lotions/deodorants the morning of surgery.  Please wear clean clothes to the hospital/surgery center.  FAILURE TO FOLLOW THESE INSTRUCTIONS MAY RESULT IN THE CANCELLATION OF YOUR SURGERY PATIENT SIGNATURE_________________________________  NURSE SIGNATURE__________________________________  ________________________________________________________________________

## 2017-10-25 ENCOUNTER — Encounter (HOSPITAL_COMMUNITY)
Admission: RE | Admit: 2017-10-25 | Discharge: 2017-10-25 | Disposition: A | Discharge status: Home / Self Care | Payer: Medicare Other | Source: Ambulatory Visit | Attending: Surgery | Admitting: Surgery

## 2017-10-25 ENCOUNTER — Encounter (HOSPITAL_COMMUNITY): Payer: Self-pay

## 2017-10-25 ENCOUNTER — Other Ambulatory Visit: Payer: Self-pay

## 2017-10-25 DIAGNOSIS — D1721 Benign lipomatous neoplasm of skin and subcutaneous tissue of right arm: Secondary | ICD-10-CM | POA: Diagnosis not present

## 2017-10-25 DIAGNOSIS — I739 Peripheral vascular disease, unspecified: Secondary | ICD-10-CM | POA: Diagnosis not present

## 2017-10-25 DIAGNOSIS — F419 Anxiety disorder, unspecified: Secondary | ICD-10-CM | POA: Diagnosis not present

## 2017-10-25 DIAGNOSIS — Z79899 Other long term (current) drug therapy: Secondary | ICD-10-CM | POA: Diagnosis not present

## 2017-10-25 HISTORY — DX: Other complications of anesthesia, initial encounter: T88.59XA

## 2017-10-25 HISTORY — DX: Adverse effect of unspecified anesthetic, initial encounter: T41.45XA

## 2017-10-25 LAB — BASIC METABOLIC PANEL
ANION GAP: 6 (ref 5–15)
BUN: 17 mg/dL (ref 6–20)
CO2: 26 mmol/L (ref 22–32)
Calcium: 9.4 mg/dL (ref 8.9–10.3)
Chloride: 107 mmol/L (ref 101–111)
Creatinine, Ser: 0.99 mg/dL (ref 0.44–1.00)
GFR calc non Af Amer: 53 mL/min — ABNORMAL LOW (ref >60)
Glucose, Bld: 122 mg/dL — ABNORMAL HIGH (ref 65–99)
POTASSIUM: 4.3 mmol/L (ref 3.5–5.1)
Sodium: 139 mmol/L (ref 135–145)

## 2017-10-25 LAB — CBC
HEMATOCRIT: 39.2 % (ref 36.0–46.0)
Hemoglobin: 12.3 g/dL (ref 12.0–15.0)
MCH: 28.1 pg (ref 26.0–34.0)
MCHC: 31.4 g/dL (ref 30.0–36.0)
MCV: 89.7 fL (ref 78.0–100.0)
Platelets: 263 10*3/uL (ref 150–400)
RBC: 4.37 MIL/uL (ref 3.87–5.11)
RDW: 13.8 % (ref 11.5–15.5)
WBC: 2.9 10*3/uL — AB (ref 4.0–10.5)

## 2017-10-27 NOTE — Anesthesia Preprocedure Evaluation (Addendum)
Anesthesia Evaluation  Patient identified by MRN, date of birth, ID band Patient awake    Reviewed: Allergy & Precautions, NPO status , Patient's Chart, lab work & pertinent test results  Airway Mallampati: II  TM Distance: >3 FB Neck ROM: Full    Dental  (+) Edentulous Upper, Lower Dentures, Edentulous Lower, Upper Dentures   Pulmonary neg pulmonary ROS,    Pulmonary exam normal breath sounds clear to auscultation       Cardiovascular + Peripheral Vascular Disease  Normal cardiovascular exam+ Valvular Problems/Murmurs MVP  Rhythm:Regular Rate:Normal     Neuro/Psych Anxiety negative neurological ROS     GI/Hepatic negative GI ROS, Neg liver ROS,   Endo/Other  negative endocrine ROS  Renal/GU negative Renal ROS  negative genitourinary   Musculoskeletal  (+) Arthritis ,   Abdominal   Peds  Hematology negative hematology ROS (+)   Anesthesia Other Findings   Reproductive/Obstetrics                           Anesthesia Physical Anesthesia Plan  ASA: II  Anesthesia Plan: General   Post-op Pain Management:    Induction: Intravenous  PONV Risk Score and Plan: 3 and Treatment may vary due to age or medical condition and Ondansetron  Airway Management Planned: LMA  Additional Equipment: None  Intra-op Plan:   Post-operative Plan: Extubation in OR  Informed Consent: I have reviewed the patients History and Physical, chart, labs and discussed the procedure including the risks, benefits and alternatives for the proposed anesthesia with the patient or authorized representative who has indicated his/her understanding and acceptance.     Plan Discussed with: CRNA and Anesthesiologist  Anesthesia Plan Comments: ( )       Anesthesia Quick Evaluation

## 2017-10-27 NOTE — Progress Notes (Signed)
Final EKG done 10/25/2017-epic

## 2017-10-27 NOTE — H&P (Signed)
[name] Documented: 10/22/2017 10:16 AM Location: [facility] Patient #: [id number] DOB: 01-10-1941 Widowed / Language: Cleophus Molt / Race: White Female   History of Present Illness (Knolan Simien A. Ninfa Linden MD; 10/22/2017 11:04 AM) The patient is a 77 year old female who presents with a complaint of Mass. This patient is referred by Dr. Velna Hatchet for evaluation of a mass on her right upper arm. The patient reports she had a previous mass removed in the same area approximately 20 years ago. The mass is now recently recurred. She is having some discomfort with the mass as it is only in her arm and rubbing up against her clothes. She has some discomfort with this and reports that it is increasing in size. She is otherwise without complaints.   Past Surgical History Levonne Spiller, CMA; 10/22/2017 10:16 AM) Appendectomy  Cataract Surgery  Bilateral. Cesarean Section - 1  Colon Polyp Removal - Colonoscopy  Hip Surgery  Bilateral.  Diagnostic Studies History Levonne Spiller, CMA; 10/22/2017 10:16 AM) Colonoscopy  1-5 years ago Mammogram  >3 years ago Pap Smear  >5 years ago  Allergies Levonne Spiller, CMA; 10/22/2017 10:16 AM) Penicillins  Allergies Reconciled   Medication History (Danielle Gerrigner, CMA; 10/22/2017 10:17 AM) Vitamin D (2000UNIT Tablet, Oral) Active. Caltrate 600+D (600-800MG -UNIT Tablet, Oral) Active. Medications Reconciled  Social History Andee Poles Education officer, [employer], CMA; 10/22/2017 10:16 AM) Caffeine use  Carbonated beverages, Coffee, Tea. No alcohol use  No drug use  Tobacco use  Never smoker.  Family History Levonne Spiller, CMA; 10/22/2017 10:16 AM) Anesthetic complications  Family Members In General. Cancer  Brother. Cerebrovascular Accident  Brother. Colon Polyps  Family Members In General. Diabetes Mellitus  Brother, Father. Heart Disease  Father, Mother, Sister. Heart disease in female family member before age 72   Hypertension  Sister. Migraine Headache  Sister.  Pregnancy / Birth History Levonne Spiller, CMA; 10/22/2017 10:16 AM) Age at menarche  71 years. Age of menopause  49-50 Contraceptive History  Oral contraceptives. Gravida  2 Maternal age  31-25 Para  2  Other Problems Levonne Spiller, CMA; 10/22/2017 10:16 AM) No pertinent past medical history     Review of Systems Andee Poles Gerrigner CMA; 10/22/2017 10:16 AM) General Not Present- Appetite Loss, Chills, Fatigue, Fever, Night Sweats, Weight Gain and Weight Loss. Skin Not Present- Change in Wart/Mole, Dryness, Hives, Jaundice, New Lesions, Non-Healing Wounds, Rash and Ulcer. HEENT Not Present- Earache, Hearing Loss, Hoarseness, Nose Bleed, Oral Ulcers, Ringing in the Ears, Seasonal Allergies, Sinus Pain, Sore Throat, Visual Disturbances, Wears glasses/contact lenses and Yellow Eyes. Respiratory Present- Snoring. Not Present- Bloody sputum, Chronic Cough, Difficulty Breathing and Wheezing. Breast Not Present- Breast Mass, Breast Pain, Nipple Discharge and Skin Changes. Cardiovascular Not Present- Chest Pain, Difficulty Breathing Lying Down, Leg Cramps, Palpitations, Rapid Heart Rate, Shortness of Breath and Swelling of Extremities. Gastrointestinal Not Present- Abdominal Pain, Bloating, Bloody Stool, Change in Bowel Habits, Chronic diarrhea, Constipation, Difficulty Swallowing, Excessive gas, Gets full quickly at meals, Hemorrhoids, Indigestion, Nausea, Rectal Pain and Vomiting. Female Genitourinary Not Present- Frequency, Nocturia, Painful Urination, Pelvic Pain and Urgency. Musculoskeletal Not Present- Back Pain, Joint Pain, Joint Stiffness, Muscle Pain, Muscle Weakness and Swelling of Extremities. Neurological Not Present- Decreased Memory, Fainting, Headaches, Numbness, Seizures, Tingling, Tremor, Trouble walking and Weakness. Psychiatric Not Present- Anxiety, Bipolar, Change in Sleep Pattern, Depression, Fearful and  Frequent crying. Endocrine Not Present- Cold Intolerance, Excessive Hunger, Hair Changes, Heat Intolerance, Hot flashes and New Diabetes. Hematology Not Present- Blood Thinners, Easy Bruising, Excessive  bleeding, Gland problems, HIV and Persistent Infections.  Vitals Andee Poles Gerrigner CMA; 10/22/2017 10:17 AM) 10/22/2017 10:17 AM Weight: 182.13 lb Height: 67in Body Surface Area: 1.94 m Body Mass Index: 28.52 kg/m  Temp.: 97.76F(Oral)  Pulse: 77 (Regular)  BP: 136/80 (Sitting, Right Arm, Standard)       Physical Exam (Cliffton Spradley A. Ninfa Linden MD; 10/22/2017 11:05 AM) The physical exam findings are as follows: Note:On exam, she has a 5 cm septated mass on the upper inner arm of the right side. There is no adenopathy. The mass is nonpulsatile. There are slight skin changes. It is nontender today. Generally she is well appearance Lungs clear bilaterally Cardiovascular is regular rate and rhythm    Assessment & Plan (Chavez Rosol A. 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 I have no pathology report from the previous mass removal. Surgical excision of this is recommended for his logic evaluation as it is recurrent and getting larger. I discussed the surgical procedure in detail. We discussed the risk which includes but is not limited to bleeding, infection, injury to any structures, recurrence, the need for further procedures, etc. We also discussed postoperative recovery. She understands and wished to proceed with surgery

## 2017-10-28 ENCOUNTER — Other Ambulatory Visit: Payer: Self-pay

## 2017-10-28 ENCOUNTER — Encounter (HOSPITAL_COMMUNITY)
Admission: RE | Disposition: A | Discharge status: Home / Self Care | Payer: Self-pay | Source: Ambulatory Visit | Attending: Surgery

## 2017-10-28 ENCOUNTER — Encounter (HOSPITAL_COMMUNITY): Payer: Self-pay | Admitting: *Deleted

## 2017-10-28 ENCOUNTER — Ambulatory Visit (HOSPITAL_COMMUNITY): Payer: Medicare Other | Admitting: Anesthesiology

## 2017-10-28 ENCOUNTER — Ambulatory Visit (HOSPITAL_COMMUNITY)
Admission: RE | Admit: 2017-10-28 | Discharge: 2017-10-28 | Disposition: A | Discharge status: Home / Self Care | Payer: Medicare Other | Source: Ambulatory Visit | Attending: Surgery | Admitting: Surgery

## 2017-10-28 DIAGNOSIS — D1721 Benign lipomatous neoplasm of skin and subcutaneous tissue of right arm: Secondary | ICD-10-CM | POA: Insufficient documentation

## 2017-10-28 DIAGNOSIS — I739 Peripheral vascular disease, unspecified: Secondary | ICD-10-CM | POA: Insufficient documentation

## 2017-10-28 DIAGNOSIS — M8580 Other specified disorders of bone density and structure, unspecified site: Secondary | ICD-10-CM | POA: Diagnosis not present

## 2017-10-28 DIAGNOSIS — Z79899 Other long term (current) drug therapy: Secondary | ICD-10-CM | POA: Insufficient documentation

## 2017-10-28 DIAGNOSIS — E785 Hyperlipidemia, unspecified: Secondary | ICD-10-CM | POA: Diagnosis not present

## 2017-10-28 DIAGNOSIS — F419 Anxiety disorder, unspecified: Secondary | ICD-10-CM | POA: Insufficient documentation

## 2017-10-28 DIAGNOSIS — R2231 Localized swelling, mass and lump, right upper limb: Secondary | ICD-10-CM | POA: Diagnosis not present

## 2017-10-28 HISTORY — PX: MASS EXCISION: SHX2000

## 2017-10-28 SURGERY — EXCISION MASS
Anesthesia: General | Site: Arm Upper | Laterality: Right

## 2017-10-28 MED ORDER — PROPOFOL 10 MG/ML IV BOLUS
INTRAVENOUS | Status: AC
  Filled 2017-10-28: qty 20

## 2017-10-28 MED ORDER — BUPIVACAINE HCL (PF) 0.5 % IJ SOLN
INTRAMUSCULAR | Status: DC | PRN
  Administered 2017-10-28: 10 mL

## 2017-10-28 MED ORDER — FENTANYL CITRATE (PF) 100 MCG/2ML IJ SOLN
INTRAMUSCULAR | Status: AC
  Filled 2017-10-28: qty 2

## 2017-10-28 MED ORDER — CIPROFLOXACIN IN D5W 400 MG/200ML IV SOLN
400.0000 mg | INTRAVENOUS | Status: AC
  Administered 2017-10-28: 400 mg via INTRAVENOUS
  Filled 2017-10-28: qty 200

## 2017-10-28 MED ORDER — EPHEDRINE SULFATE-NACL 50-0.9 MG/10ML-% IV SOSY
PREFILLED_SYRINGE | INTRAVENOUS | Status: DC | PRN
  Administered 2017-10-28: 10 mg via INTRAVENOUS

## 2017-10-28 MED ORDER — LIDOCAINE 2% (20 MG/ML) 5 ML SYRINGE
INTRAMUSCULAR | Status: DC | PRN
  Administered 2017-10-28: 70 mg via INTRAVENOUS

## 2017-10-28 MED ORDER — LACTATED RINGERS IV SOLN
INTRAVENOUS | Status: DC
  Administered 2017-10-28: 08:00:00 via INTRAVENOUS

## 2017-10-28 MED ORDER — CHLORHEXIDINE GLUCONATE CLOTH 2 % EX PADS: 6.0000 | MEDICATED_PAD | Freq: Once | CUTANEOUS | Status: DC

## 2017-10-28 MED ORDER — PROPOFOL 10 MG/ML IV BOLUS
INTRAVENOUS | Status: DC | PRN
  Administered 2017-10-28: 130 mg via INTRAVENOUS

## 2017-10-28 MED ORDER — MEPERIDINE HCL 50 MG/ML IJ SOLN: 6.2500 mg | INTRAMUSCULAR | Status: DC | PRN

## 2017-10-28 MED ORDER — LIDOCAINE 2% (20 MG/ML) 5 ML SYRINGE
INTRAMUSCULAR | Status: AC
  Filled 2017-10-28: qty 5

## 2017-10-28 MED ORDER — BUPIVACAINE HCL (PF) 0.5 % IJ SOLN
INTRAMUSCULAR | Status: AC
  Filled 2017-10-28: qty 30

## 2017-10-28 MED ORDER — ONDANSETRON HCL 4 MG/2ML IJ SOLN
INTRAMUSCULAR | Status: AC
  Filled 2017-10-28: qty 2

## 2017-10-28 MED ORDER — ONDANSETRON HCL 4 MG/2ML IJ SOLN
INTRAMUSCULAR | Status: DC | PRN
  Administered 2017-10-28: 4 mg via INTRAVENOUS

## 2017-10-28 MED ORDER — EPHEDRINE 5 MG/ML INJ
INTRAVENOUS | Status: AC
  Filled 2017-10-28: qty 10

## 2017-10-28 MED ORDER — FENTANYL CITRATE (PF) 100 MCG/2ML IJ SOLN: 25.0000 ug | INTRAMUSCULAR | Status: DC | PRN

## 2017-10-28 MED ORDER — 0.9 % SODIUM CHLORIDE (POUR BTL) OPTIME
TOPICAL | Status: DC | PRN
  Administered 2017-10-28: 1000 mL

## 2017-10-28 MED ORDER — FENTANYL CITRATE (PF) 100 MCG/2ML IJ SOLN
INTRAMUSCULAR | Status: DC | PRN
  Administered 2017-10-28: 25 ug via INTRAVENOUS

## 2017-10-28 SURGICAL SUPPLY — 38 items
ADH SKN CLS APL DERMABOND .7 (GAUZE/BANDAGES/DRESSINGS) ×1
BLADE HEX COATED 2.75 (ELECTRODE) ×2 IMPLANT
BLADE SURG 15 STRL LF DISP TIS (BLADE) ×1 IMPLANT
BLADE SURG 15 STRL SS (BLADE)
BLADE SURG SZ10 CARB STEEL (BLADE) ×1 IMPLANT
CHLORAPREP W/TINT 26ML (MISCELLANEOUS) ×2 IMPLANT
DECANTER SPIKE VIAL GLASS SM (MISCELLANEOUS) ×1 IMPLANT
DERMABOND ADVANCED (GAUZE/BANDAGES/DRESSINGS) ×1
DERMABOND ADVANCED .7 DNX12 (GAUZE/BANDAGES/DRESSINGS) IMPLANT
DRAIN PENROSE 18X1/2 LTX STRL (DRAIN) ×1 IMPLANT
DRAPE LAPAROTOMY T 98X78 PEDS (DRAPES) ×1 IMPLANT
DRAPE LAPAROTOMY TRNSV 102X78 (DRAPE) ×1 IMPLANT
ELECT PENCIL ROCKER SW 15FT (MISCELLANEOUS) ×1 IMPLANT
ELECT REM PT RETURN 15FT ADLT (MISCELLANEOUS) ×2 IMPLANT
GAUZE SPONGE 4X4 12PLY STRL (GAUZE/BANDAGES/DRESSINGS) IMPLANT
GAUZE SPONGE 4X4 16PLY XRAY LF (GAUZE/BANDAGES/DRESSINGS) IMPLANT
GLOVE SURG SIGNA 7.5 PF LTX (GLOVE) ×3 IMPLANT
GOWN STRL REUS W/TWL LRG LVL3 (GOWN DISPOSABLE) ×2 IMPLANT
GOWN STRL REUS W/TWL XL LVL3 (GOWN DISPOSABLE) ×3 IMPLANT
KIT BASIN OR (CUSTOM PROCEDURE TRAY) ×2 IMPLANT
NDL HYPO 25X1 1.5 SAFETY (NEEDLE) ×1 IMPLANT
NEEDLE HYPO 22GX1.5 SAFETY (NEEDLE) ×1 IMPLANT
NEEDLE HYPO 25X1 1.5 SAFETY (NEEDLE) IMPLANT
NS IRRIG 1000ML POUR BTL (IV SOLUTION) ×2 IMPLANT
PACK BASIC VI WITH GOWN DISP (CUSTOM PROCEDURE TRAY) ×1 IMPLANT
SPONGE LAP 4X18 X RAY DECT (DISPOSABLE) ×1 IMPLANT
STRIP CLOSURE SKIN 1/2X4 (GAUZE/BANDAGES/DRESSINGS) IMPLANT
SUT MNCRL AB 4-0 PS2 18 (SUTURE) ×2 IMPLANT
SUT VIC AB 2-0 CT1 27 (SUTURE)
SUT VIC AB 2-0 CT1 TAPERPNT 27 (SUTURE) IMPLANT
SUT VIC AB 3-0 54XBRD REEL (SUTURE) IMPLANT
SUT VIC AB 3-0 BRD 54 (SUTURE)
SUT VIC AB 3-0 SH 27 (SUTURE) ×2
SUT VIC AB 3-0 SH 27XBRD (SUTURE) IMPLANT
SYR BULB IRRIGATION 50ML (SYRINGE) IMPLANT
SYR CONTROL 10ML LL (SYRINGE) ×2 IMPLANT
TOWEL OR 17X26 10 PK STRL BLUE (TOWEL DISPOSABLE) ×2 IMPLANT
YANKAUER SUCT BULB TIP 10FT TU (MISCELLANEOUS) ×1 IMPLANT

## 2017-10-28 NOTE — Transfer of Care (Signed)
Immediate Anesthesia Transfer of Care Note  Patient: Morgan [name]  Procedure(s) Performed: EXCISION RIGHT UPPER ARM MASS (Right Arm Upper)  Patient Location: PACU  Anesthesia Type:General  Level of Consciousness: drowsy and patient cooperative  Airway & Oxygen Therapy: Patient Spontanous Breathing and Patient connected to face mask oxygen  Post-op Assessment: Report given to RN and Post -op Vital signs reviewed and stable  Post vital signs: Reviewed and stable  Last Vitals:  Vitals:   10/28/17 0803  BP: (!) 146/62  Pulse: 99  Temp: 36.5 C  SpO2: 100%    Last Pain:  Vitals:   10/28/17 0803  TempSrc: Oral      Patients Stated Pain Goal: 4 (16/10/96 0454)  Complications: No apparent anesthesia complications

## 2017-10-28 NOTE — Discharge Instructions (Signed)
You may shower starting tomorrow.  No soaking in a tub for 1 week  You may use an ice pack, Tylenol, and ibuprofen for pain  No vigorous activity for 1 week

## 2017-10-28 NOTE — Op Note (Signed)
EXCISION RIGHT UPPER ARM MASS  Procedure Note  Morgan [name] 10/28/2017   Pre-op Diagnosis: Right upper arm mass     Post-op Diagnosis: same  Procedure(s): EXCISION RIGHT UPPER ARM MASS (5 cm)  Surgeon(s): Coralie Keens, MD  Anesthesia: Choice  Staff:  Circulator: Barbee Shropshire, RN Scrub Person: Mauricia Area Float Surgical Tech: Jilda Roche M, CST  Estimated Blood Loss: Minimal               Specimens: sent to path  Procedure: The patient was brought to the operating room and identified as the correct patient.  She was placed supine in the operating room table and general anesthesia was induced.  Her right upper arm was then prepped and draped in the usual sterile fashion.  There was [name] visible, soft, movable mass on the inner right upper arm.  This had been excised previously and there was [name] transverse scar on the arm.  I anesthetized the skin around the mass with Marcaine.  I then performed an elliptical incision with [name] scalpel removing the previous scar and visible mass.  I took this down into the subcutaneous tissue with electrocautery.  The underlying mass appeared consistent with [name] lipoma.  I excised it in its entirety and it was sent to pathology for evaluation.  Hemostasis was achieved with the cautery.  I then closed the subcutaneous tissue with interrupted 3-0 Vicryl sutures and closed the skin with [name] running 4-0 Monocryl.  Dermabond was then applied.  The patient tolerated the procedure well.  All the counts were correct at the end of the procedure.  The patient was then extubated in the operating room and taken in [name] stable condition to the recovery room.          Morgan [name]   Date: 10/28/2017  Time: 9:24 AM

## 2017-10-28 NOTE — Anesthesia Postprocedure Evaluation (Signed)
Anesthesia Post Note  Patient: Morgan [name]  Procedure(s) Performed: EXCISION RIGHT UPPER ARM MASS (Right Arm Upper)     Patient location during evaluation: PACU Anesthesia Type: General Level of consciousness: awake and alert Pain management: pain level controlled Vital Signs Assessment: post-procedure vital signs reviewed and stable Respiratory status: spontaneous breathing, nonlabored ventilation and respiratory function stable Cardiovascular status: blood pressure returned to baseline and stable Postop Assessment: no apparent nausea or vomiting Anesthetic complications: no    Last Vitals:  Vitals:   10/28/17 1000 10/28/17 1023  BP: (!) 120/58 124/69  Pulse: 62 62  Resp: 15 16  Temp: (!) 36.4 C 36.6 C  SpO2: 100% 96%    Last Pain:  Vitals:   10/28/17 0803  TempSrc: Oral                 Audry Pili

## 2017-10-28 NOTE — Interval H&P Note (Signed)
History and Physical Interval Note:no change in H and P  10/28/2017 8:36 AM  [name]  has presented today for surgery, with the diagnosis of Right upper arm mass  The various methods of treatment have been discussed with the patient and family. After consideration of risks, benefits and other options for treatment, the patient has consented to  Procedure(s): EXCISION RIGHT UPPER ARM MASS (Right) as [name] surgical intervention .  The patient's history has been reviewed, patient examined, no change in status, stable for surgery.  I have reviewed the patient's chart and labs.  Questions were answered to the patient's satisfaction.     Morgan [name]

## 2017-10-28 NOTE — Anesthesia Procedure Notes (Signed)
Procedure Name: LMA Insertion Date/Time: 10/28/2017 9:02 AM Performed by: Montel Clock, CRNA Pre-anesthesia Checklist: Patient identified, Emergency Drugs available, Suction available, Patient being monitored and Timeout performed Patient Re-evaluated:Patient Re-evaluated prior to induction Oxygen Delivery Method: Circle system utilized Preoxygenation: Pre-oxygenation with 100% oxygen Induction Type: IV induction LMA: LMA inserted LMA Size: 4.0 Number of attempts: 1 Dental Injury: Teeth and Oropharynx as per pre-operative assessment

## 2017-11-04 DIAGNOSIS — L300 Nummular dermatitis: Secondary | ICD-10-CM | POA: Diagnosis not present

## 2018-03-22 DIAGNOSIS — R946 Abnormal results of thyroid function studies: Secondary | ICD-10-CM | POA: Diagnosis not present

## 2018-03-22 DIAGNOSIS — E7849 Other hyperlipidemia: Secondary | ICD-10-CM | POA: Diagnosis not present

## 2018-03-22 DIAGNOSIS — E559 Vitamin D deficiency, unspecified: Secondary | ICD-10-CM | POA: Diagnosis not present

## 2018-03-22 DIAGNOSIS — R82998 Other abnormal findings in urine: Secondary | ICD-10-CM | POA: Diagnosis not present

## 2018-03-29 DIAGNOSIS — E7849 Other hyperlipidemia: Secondary | ICD-10-CM | POA: Diagnosis not present

## 2018-03-29 DIAGNOSIS — Z0000 Encounter for general adult medical examination without abnormal findings: Secondary | ICD-10-CM | POA: Diagnosis not present

## 2018-03-29 DIAGNOSIS — F418 Other specified anxiety disorders: Secondary | ICD-10-CM | POA: Diagnosis not present

## 2018-03-29 DIAGNOSIS — Z8249 Family history of ischemic heart disease and other diseases of the circulatory system: Secondary | ICD-10-CM | POA: Diagnosis not present

## 2018-04-08 DIAGNOSIS — Z1212 Encounter for screening for malignant neoplasm of rectum: Secondary | ICD-10-CM | POA: Diagnosis not present

## 2018-05-25 DIAGNOSIS — Z23 Encounter for immunization: Secondary | ICD-10-CM | POA: Diagnosis not present

## 2018-07-04 DIAGNOSIS — H1820 Unspecified corneal edema: Secondary | ICD-10-CM | POA: Diagnosis not present

## 2018-07-04 DIAGNOSIS — H52203 Unspecified astigmatism, bilateral: Secondary | ICD-10-CM | POA: Diagnosis not present

## 2018-07-04 DIAGNOSIS — H35373 Puckering of macula, bilateral: Secondary | ICD-10-CM | POA: Diagnosis not present

## 2018-07-04 DIAGNOSIS — H1851 Endothelial corneal dystrophy: Secondary | ICD-10-CM | POA: Diagnosis not present

## 2018-10-25 DIAGNOSIS — L821 Other seborrheic keratosis: Secondary | ICD-10-CM | POA: Diagnosis not present

## 2018-10-25 DIAGNOSIS — L820 Inflamed seborrheic keratosis: Secondary | ICD-10-CM | POA: Diagnosis not present

## 2019-04-18 DIAGNOSIS — Z23 Encounter for immunization: Secondary | ICD-10-CM | POA: Diagnosis not present

## 2019-04-18 DIAGNOSIS — D509 Iron deficiency anemia, unspecified: Secondary | ICD-10-CM | POA: Diagnosis not present

## 2019-04-18 DIAGNOSIS — E559 Vitamin D deficiency, unspecified: Secondary | ICD-10-CM | POA: Diagnosis not present

## 2019-04-18 DIAGNOSIS — E7849 Other hyperlipidemia: Secondary | ICD-10-CM | POA: Diagnosis not present

## 2019-04-25 DIAGNOSIS — Z0000 Encounter for general adult medical examination without abnormal findings: Secondary | ICD-10-CM | POA: Diagnosis not present

## 2019-04-25 DIAGNOSIS — Z8249 Family history of ischemic heart disease and other diseases of the circulatory system: Secondary | ICD-10-CM | POA: Diagnosis not present

## 2019-04-25 DIAGNOSIS — D649 Anemia, unspecified: Secondary | ICD-10-CM | POA: Diagnosis not present

## 2019-04-25 DIAGNOSIS — M810 Age-related osteoporosis without current pathological fracture: Secondary | ICD-10-CM | POA: Diagnosis not present

## 2019-04-27 DIAGNOSIS — Z1212 Encounter for screening for malignant neoplasm of rectum: Secondary | ICD-10-CM | POA: Diagnosis not present

## 2019-04-27 DIAGNOSIS — D649 Anemia, unspecified: Secondary | ICD-10-CM | POA: Diagnosis not present

## 2019-04-27 DIAGNOSIS — R946 Abnormal results of thyroid function studies: Secondary | ICD-10-CM | POA: Diagnosis not present

## 2019-04-27 DIAGNOSIS — N189 Chronic kidney disease, unspecified: Secondary | ICD-10-CM | POA: Diagnosis not present

## 2019-05-03 ENCOUNTER — Other Ambulatory Visit (HOSPITAL_COMMUNITY): Payer: Self-pay | Admitting: *Deleted

## 2019-05-04 ENCOUNTER — Other Ambulatory Visit: Payer: Self-pay

## 2019-05-04 ENCOUNTER — Ambulatory Visit (HOSPITAL_COMMUNITY)
Admission: RE | Admit: 2019-05-04 | Discharge: 2019-05-04 | Disposition: A | Discharge status: Home / Self Care | Payer: Medicare Other | Source: Ambulatory Visit | Attending: Internal Medicine | Admitting: Internal Medicine

## 2019-05-04 DIAGNOSIS — D649 Anemia, unspecified: Secondary | ICD-10-CM | POA: Diagnosis not present

## 2019-05-04 MED ORDER — FERUMOXYTOL INJECTION 510 MG/17 ML
510.0000 mg | INTRAVENOUS | Status: DC
  Administered 2019-05-04: 510 mg via INTRAVENOUS
  Filled 2019-05-04: qty 17

## 2019-05-04 NOTE — Discharge Instructions (Signed)

## 2019-05-08 ENCOUNTER — Encounter (HOSPITAL_COMMUNITY): Payer: Medicare Other

## 2019-05-11 ENCOUNTER — Other Ambulatory Visit: Payer: Self-pay

## 2019-05-11 ENCOUNTER — Ambulatory Visit (HOSPITAL_COMMUNITY)
Admission: RE | Admit: 2019-05-11 | Discharge: 2019-05-11 | Disposition: A | Discharge status: Home / Self Care | Payer: Medicare Other | Source: Ambulatory Visit | Attending: Internal Medicine | Admitting: Internal Medicine

## 2019-05-11 DIAGNOSIS — D649 Anemia, unspecified: Secondary | ICD-10-CM | POA: Insufficient documentation

## 2019-05-11 MED ORDER — SODIUM CHLORIDE 0.9 % IV SOLN
510.0000 mg | INTRAVENOUS | Status: DC
  Administered 2019-05-11: 510 mg via INTRAVENOUS
  Filled 2019-05-11: qty 17

## 2019-05-16 DIAGNOSIS — R300 Dysuria: Secondary | ICD-10-CM | POA: Diagnosis not present

## 2019-05-16 DIAGNOSIS — N390 Urinary tract infection, site not specified: Secondary | ICD-10-CM | POA: Diagnosis not present

## 2019-05-18 DIAGNOSIS — D649 Anemia, unspecified: Secondary | ICD-10-CM | POA: Diagnosis not present

## 2019-05-22 DIAGNOSIS — R21 Rash and other nonspecific skin eruption: Secondary | ICD-10-CM | POA: Diagnosis not present

## 2019-05-22 DIAGNOSIS — B379 Candidiasis, unspecified: Secondary | ICD-10-CM | POA: Diagnosis not present

## 2019-06-15 DIAGNOSIS — H52223 Regular astigmatism, bilateral: Secondary | ICD-10-CM | POA: Diagnosis not present

## 2019-06-16 DIAGNOSIS — M25552 Pain in left hip: Secondary | ICD-10-CM | POA: Diagnosis not present

## 2019-06-16 DIAGNOSIS — Z471 Aftercare following joint replacement surgery: Secondary | ICD-10-CM | POA: Diagnosis not present

## 2019-06-16 DIAGNOSIS — Z96641 Presence of right artificial hip joint: Secondary | ICD-10-CM | POA: Diagnosis not present

## 2019-06-16 DIAGNOSIS — M1612 Unilateral primary osteoarthritis, left hip: Secondary | ICD-10-CM | POA: Diagnosis not present

## 2019-07-19 NOTE — H&P (Signed)
TOTAL HIP ADMISSION H&P  Patient is admitted for left total hip arthroplasty.  Subjective:  Chief Complaint: left hip pain  HPI: ANGELEAN [name], 78 y.o. female, has a history of pain and functional disability in the left hip(s) due to arthritis and patient has failed non-surgical conservative treatments for greater than 12 weeks to include activity modification.  Onset of symptoms was gradual starting several years ago with gradually worsening course since that time.The patient noted no past surgery on the left hip(s).  Patient currently rates pain in the left hip at 5 out of 10 with activity. Patient has worsening of pain with activity and weight bearing, pain that interfers with activities of daily living and crepitus. Patient has evidence of severe bone-on-bone arthritis with large osteophyte formation and subchondral cystic formation by imaging studies. This condition presents safety issues increasing the risk of falls. There is no current active infection.  Patient Active Problem List   Diagnosis Date Noted  . ANXIETY 04/20/2010  . COLONIC POLYPS 02/13/2008  . OSTEOPENIA 02/13/2008  . HYPERLIPIDEMIA 02/10/2008  . VENOUS INSUFFICIENCY 02/10/2008  . DEGENERATIVE JOINT DISEASE 02/10/2008  . ALLERGY 02/10/2008  . HEMATURIA, HX OF 02/10/2008   Past Medical History:  Diagnosis Date  . Allergy history unknown   . Anxiety   . Complication of anesthesia    during second C-section- felt like top of head was being pulled off.  . DJD (degenerative joint disease)   . History of hematuria   . Hx of colonic polyps   . Hyperlipidemia   . Mitral valve prolapse   . Osteopenia   . Venous insufficiency     Past Surgical History:  Procedure Laterality Date  . APPENDECTOMY    . appendectomy & drainage of abscess  04/2009   Dr. Grandville Silos  . [place]  . JOINT REPLACEMENT     right hip  . MASS EXCISION Right 10/28/2017   Procedure: EXCISION RIGHT UPPER ARM MASS;  Surgeon: Coralie Keens, MD;  Location: [facility];  Service: General;  Laterality: Right;  . right ear surgery  2007   Dr. Wyvonna Plum cancer  . right hand surgery  2003   Dr. Shellia Carwin  . TOTAL HIP ARTHROPLASTY Right 09/2008   Dr. Maureen Ralphs    No current facility-administered medications for this encounter.    Current Outpatient Medications  Medication Sig Dispense Refill Last Dose  . Calcium Carbonate-Vitamin D (CALTRATE 600+D) 600-400 MG-UNIT per tablet Take 1 tablet by mouth daily.   10/27/2017 at Unknown time  . Cholecalciferol (VITAMIN D) 2000 units CAPS Take 2,000 Units by mouth daily.    10/27/2017 at Unknown time  . sodium chloride (MURO 128) 5 % ophthalmic solution Place 1 drop into both eyes as needed for eye irritation.   10/27/2017 at Unknown time   Allergies  Allergen Reactions  . Atorvastatin Other (See Comments)    REACTION: pt states INTOL to Lipitor w/ constipation  . Penicillins Hives and Other (See Comments)    Has patient had a PCN reaction causing immediate rash, facial/tongue/throat swelling, SOB or lightheadedness with hypotension: No Has patient had a PCN reaction causing severe rash involving mucus membranes or skin necrosis: No Has patient had a PCN reaction that required hospitalization: No Has patient had a PCN reaction occurring within the last 10 years: No If all of the above answers are "NO", then may proceed with Cephalosporin use.     Social History   Tobacco Use  . Smoking  status: Never Smoker  . Smokeless tobacco: Never Used  Substance Use Topics  . Alcohol use: No    Alcohol/week: 0.0 standard drinks    Family History  Problem Relation Age of Onset  . Colon cancer Paternal Aunt 6  . Colon cancer Paternal Grandmother 35  . Colon cancer Paternal Aunt        unsure age of onset  . Colon cancer Paternal Aunt        unsure age of onset     Review of Systems  Constitutional: Negative for chills and fever.  HENT: Negative for congestion, sore throat and tinnitus.    Eyes: Negative for double vision, photophobia and pain.  Respiratory: Negative for cough, shortness of breath and wheezing.   Cardiovascular: Negative for chest pain, palpitations and orthopnea.  Gastrointestinal: Negative for heartburn, nausea and vomiting.  Genitourinary: Negative for dysuria, frequency and urgency.  Musculoskeletal: Positive for joint pain.  Neurological: Negative for dizziness, weakness and headaches.    Objective:  Physical Exam  Well nourished and well developed.  General: Alert and oriented x3, cooperative and pleasant, no acute distress.  Head: normocephalic, atraumatic, neck supple.  Eyes: EOMI.  Respiratory: breath sounds clear in all fields, no wheezing, rales, or rhonchi. Cardiovascular: Regular rate and rhythm, no murmurs, gallops or rubs.  Abdomen: non-tender to palpation and soft, normoactive bowel sounds. Musculoskeletal:  Left Hip Exam: ROM: Flexion to 100, Internal Rotation 0, External Rotation 0, and Abduction 20 degrees. There is no tenderness over the greater trochanter bursa. There is no pain on provocative testing of the hip.  Calves soft and nontender. Motor function intact in LE. Strength 5/5 LE bilaterally.  Vital signs in last 24 hours: Blood pressure: 136/80 mmHg Pulse: 68 bpm  Labs:   Estimated body mass index is 28.94 kg/m as calculated from the following:   Height as of 05/11/19: 5' 6.5" (1.689 m).   Weight as of 05/11/19: 82.6 kg.   Imaging Review Plain radiographs demonstrate severe degenerative joint disease of the left hip(s). The bone quality appears to be adequate for age and reported activity level.  Assessment/Plan:  End stage arthritis, left hip(s)  The patient history, physical examination, clinical judgement of the provider and imaging studies are consistent with end stage degenerative joint disease of the left hip(s) and total hip arthroplasty is deemed medically necessary. The treatment options including  medical management, injection therapy, arthroscopy and arthroplasty were discussed at length. The risks and benefits of total hip arthroplasty were presented and reviewed. The risks due to aseptic loosening, infection, stiffness, dislocation/subluxation,  thromboembolic complications and other imponderables were discussed.  The patient acknowledged the explanation, agreed to proceed with the plan and consent was signed. Patient is being admitted for inpatient treatment for surgery, pain control, PT, OT, prophylactic antibiotics, VTE prophylaxis, progressive ambulation and ADL's and discharge planning.The patient is planning to be discharged home.   Anticipated LOS equal to or greater than 2 midnights due to - Age 7 and older with one or more of the following:  - Obesity  - Expected need for hospital services (PT, OT, Nursing) required for safe  discharge  - Anticipated need for postoperative skilled nursing care or inpatient rehab  - Active co-morbidities: None OR   - Unanticipated findings during/Post Surgery: None  - Patient is a high risk of re-admission due to: None  Therapy Plans: HEP Disposition: Home with sisters Planned DVT Prophylaxis: Aspirin 325 mg BID DME needed: None PCP: Velna Hatchet, MD  TXA: IV Allergies: PCN (hives) Anesthesia Concerns: Bad experience with previous spinal anesthesia. Per patient it was "the most painful thing I have ever experienced in my life." BMI: 29.4  Other: Recently required iron transfusion per Dr. Ardeth Perfect, most recent hemoglobin 10.7 on 05/18/2019. Currently taking ferrous sulfate QD. Will recheck with pre-op labs. Refuses spinal anesthesia.  - Patient was instructed on what medications to stop prior to surgery. - Follow-up visit in 2 weeks with Dr. Wynelle Link - Begin physical therapy following surgery - Pre-operative lab work as pre-surgical testing - Prescriptions will be provided in hospital at time of discharge  Theresa Duty, PA-C  Orthopedic Surgery [facility] [place]

## 2019-07-27 NOTE — Patient Instructions (Addendum)
DUE TO COVID-19 ONLY ONE VISITOR IS ALLOWED TO COME WITH YOU AND STAY IN THE WAITING ROOM ONLY DURING PRE OP AND PROCEDURE DAY OF SURGERY. THE 1 VISITOR MAY VISIT WITH YOU AFTER SURGERY IN YOUR PRIVATE ROOM DURING VISITING HOURS ONLY!  YOU NEED TO HAVE A COVID 19 TEST ON_______ @_______ , THIS TEST MUST BE DONE BEFORE SURGERY, COME  [place], [place] , [address].  ([place]) ONCE YOUR COVID TEST IS COMPLETED, PLEASE BEGIN THE QUARANTINE INSTRUCTIONS AS OUTLINED IN YOUR HANDOUT.                [name]     Your procedure is scheduled on:    Report to [facility] Main  Entrance    Report to admitting at  1:10 PM     Call this number if you have problems the morning of surgery [phone]    Remember: Do not eat food  :After Midnight.     NO SOLID FOOD AFTER MIDNIGHT THE NIGHT PRIOR TO SURGERY. NOTHING BY MOUTH EXCEPT CLEAR LIQUIDS UNTIL  12:50 pm . PLEASE FINISH ENSURE DRINK PER SURGEON ORDER  WHICH NEEDS TO BE COMPLETED AT 12:50 pm.   CLEAR LIQUID DIET   Foods Allowed                                                                     Foods Excluded  Coffee and tea, regular and decaf                             liquids that you cannot  Plain Jell-O any favor except red or purple                                           see through such as: Fruit ices (not with fruit pulp)                                     milk, soups, orange juice  Iced Popsicles                                    All solid food Carbonated beverages, regular and diet                                    Cranberry, grape and apple juices Sports drinks like Gatorade Lightly seasoned clear broth or consume(fat free) Sugar, honey syrup  Sample Menu Breakfast                                Lunch                                     Supper  Cranberry juice                    Beef broth                            Chicken broth Jell-O                                     Grape  juice                           Apple juice Coffee or tea                        Jell-O                                      Popsicle                                                Coffee or tea                        Coffee or tea  _____________________________________________________________________     BRUSH YOUR TEETH MORNING OF SURGERY AND RINSE YOUR MOUTH OUT, NO CHEWING GUM CANDY OR MINTS.     Take these medicines the morning of surgery with A SIP OF WATER: use eye drops if needed                                 You may not have any metal on your body including hair pins and              piercings     Do not wear jewelry, make-up, lotions, powders or perfumes, deodorant             Do not wear nail polish on your fingernails.  Do not shave  48 hours prior to surgery.                 Do not bring valuables to the hospital. [place].  Contacts, dentures or bridgework may not be worn into surgery.  Leave suitcase in the car. After surgery it may be brought to your room.                   Please read over the following fact sheets you were given: _____________________________________________________________________             [facility] - Preparing for Surgery Before surgery, you can play an important role.  Because skin is not sterile, your skin needs to be as free of germs as possible.  You can reduce the number of germs on your skin by washing with CHG (chlorahexidine gluconate) soap before surgery.  CHG is an antiseptic cleaner which kills germs and bonds with the skin to continue killing germs even after washing. Please DO NOT use if you have an allergy  to CHG or antibacterial soaps.  If your skin becomes reddened/irritated stop using the CHG and inform your nurse when you arrive at Short Stay. Do not shave (including legs and underarms) for at least 48 hours prior to the first CHG shower.  You may shave your  face/neck. Please follow these instructions carefully:  1.  Shower with CHG Soap the night before surgery and the  morning of Surgery.  2.  If you choose to wash your hair, wash your hair first as usual with your  normal  shampoo.  3.  After you shampoo, rinse your hair and body thoroughly to remove the  shampoo.                           4.  Use CHG as you would any other liquid soap.  You can apply chg directly  to the skin and wash                       Gently with a scrungie or clean washcloth.  5.  Apply the CHG Soap to your body ONLY FROM THE NECK DOWN.   Do not use on face/ open                           Wound or open sores. Avoid contact with eyes, ears mouth and genitals (private parts).                       Wash face,  Genitals (private parts) with your normal soap.             6.  Wash thoroughly, paying special attention to the area where your surgery  will be performed.  7.  Thoroughly rinse your body with warm water from the neck down.  8.  DO NOT shower/wash with your normal soap after using and rinsing off  the CHG Soap.                9.  Pat yourself dry with a clean towel.            10.  Wear clean pajamas.            11.  Place clean sheets on your bed the night of your first shower and do not  sleep with pets. Day of Surgery : Do not apply any lotions/deodorants the morning of surgery.  Please wear clean clothes to the hospital/surgery center.  FAILURE TO FOLLOW THESE INSTRUCTIONS MAY RESULT IN THE CANCELLATION OF YOUR SURGERY PATIENT SIGNATURE_________________________________  NURSE SIGNATURE__________________________________  ________________________________________________________________________   Adam Phenix  An incentive spirometer is a tool that can help keep your lungs clear and active. This tool measures how well you are filling your lungs with each breath. Taking long deep breaths may help reverse or decrease the chance of developing breathing  (pulmonary) problems (especially infection) following:  A long period of time when you are unable to move or be active. BEFORE THE PROCEDURE   If the spirometer includes an indicator to show your best effort, your nurse or respiratory therapist will set it to a desired goal.  If possible, sit up straight or lean slightly forward. Try not to slouch.  Hold the incentive spirometer in an upright position. INSTRUCTIONS FOR USE  1. Sit on the edge of your bed if possible, or sit up as far as  you can in bed or on a chair. 2. Hold the incentive spirometer in an upright position. 3. Breathe out normally. 4. Place the mouthpiece in your mouth and seal your lips tightly around it. 5. Breathe in slowly and as deeply as possible, raising the piston or the ball toward the top of the column. 6. Hold your breath for 3-5 seconds or for as long as possible. Allow the piston or ball to fall to the bottom of the column. 7. Remove the mouthpiece from your mouth and breathe out normally. 8. Rest for a few seconds and repeat Steps 1 through 7 at least 10 times every 1-2 hours when you are awake. Take your time and take a few normal breaths between deep breaths. 9. The spirometer may include an indicator to show your best effort. Use the indicator as a goal to work toward during each repetition. 10. After each set of 10 deep breaths, practice coughing to be sure your lungs are clear. If you have an incision (the cut made at the time of surgery), support your incision when coughing by placing a pillow or rolled up towels firmly against it. Once you are able to get out of bed, walk around indoors and cough well. You may stop using the incentive spirometer when instructed by your caregiver.  RISKS AND COMPLICATIONS  Take your time so you do not get dizzy or light-headed.  If you are in pain, you may need to take or ask for pain medication before doing incentive spirometry. It is harder to take a deep breath if you  are having pain. AFTER USE  Rest and breathe slowly and easily.  It can be helpful to keep track of a log of your progress. Your caregiver can provide you with a simple table to help with this. If you are using the spirometer at home, follow these instructions: [place] IF:   You are having difficultly using the spirometer.  You have trouble using the spirometer as often as instructed.  Your pain medication is not giving enough relief while using the spirometer.  You develop fever of 100.5 F (38.1 C) or higher. SEEK IMMEDIATE MEDICAL CARE IF:   You cough up bloody sputum that had not been present before.  You develop fever of 102 F (38.9 C) or greater.  You develop worsening pain at or near the incision site. MAKE SURE YOU:   Understand these instructions.  Will watch your condition.  Will get help right away if you are not doing well or get worse. Document Released: 12/14/2006 Document Revised: 10/26/2011 Document Reviewed: 02/14/2007 ExitCare Patient Information 2014 ExitCare, [place].   ________________________________________________________________________  WHAT IS A BLOOD TRANSFUSION? Blood Transfusion Information  A transfusion is the replacement of blood or some of its parts. Blood is made up of multiple cells which provide different functions.  Red blood cells carry oxygen and are used for blood loss replacement.  White blood cells fight against infection.  Platelets control bleeding.  Plasma helps clot blood.  Other blood products are available for specialized needs, such as hemophilia or other clotting disorders. BEFORE THE TRANSFUSION  Who gives blood for transfusions?   Healthy volunteers who are fully evaluated to make sure their blood is safe. This is blood bank blood. Transfusion therapy is the safest it has ever been in the practice of medicine. Before blood is taken from a donor, a complete history is taken to make sure that person has  no history of diseases  nor engages in risky social behavior (examples are intravenous drug use or sexual activity with multiple partners). The donor's travel history is screened to minimize risk of transmitting infections, such as malaria. The donated blood is tested for signs of infectious diseases, such as HIV and hepatitis. The blood is then tested to be sure it is compatible with you in order to minimize the chance of a transfusion reaction. If you or a relative donates blood, this is often done in anticipation of surgery and is not appropriate for emergency situations. It takes many days to process the donated blood. RISKS AND COMPLICATIONS Although transfusion therapy is very safe and saves many lives, the main dangers of transfusion include:   Getting an infectious disease.  Developing a transfusion reaction. This is an allergic reaction to something in the blood you were given. Every precaution is taken to prevent this. The decision to have a blood transfusion has been considered carefully by your caregiver before blood is given. Blood is not given unless the benefits outweigh the risks. AFTER THE TRANSFUSION  Right after receiving a blood transfusion, you will usually feel much better and more energetic. This is especially true if your red blood cells have gotten low (anemic). The transfusion raises the level of the red blood cells which carry oxygen, and this usually causes an energy increase.  The nurse administering the transfusion will monitor you carefully for complications. HOME CARE INSTRUCTIONS  No special instructions are needed after a transfusion. You may find your energy is better. Speak with your caregiver about any limitations on activity for underlying diseases you may have. SEEK MEDICAL CARE IF:   Your condition is not improving after your transfusion.  You develop redness or irritation at the intravenous (IV) site. SEEK IMMEDIATE MEDICAL CARE IF:  Any of the following  symptoms occur over the next 12 hours:  Shaking chills.  You have a temperature by mouth above 102 F (38.9 C), not controlled by medicine.  Chest, back, or muscle pain.  People around you feel you are not acting correctly or are confused.  Shortness of breath or difficulty breathing.  Dizziness and fainting.  You get a rash or develop hives.  You have a decrease in urine output.  Your urine turns a dark color or changes to pink, red, or brown. Any of the following symptoms occur over the next 10 days:  You have a temperature by mouth above 102 F (38.9 C), not controlled by medicine.  Shortness of breath.  Weakness after normal activity.  The white part of the eye turns yellow (jaundice).  You have a decrease in the amount of urine or are urinating less often.  Your urine turns a dark color or changes to pink, red, or brown. Document Released: 07/31/2000 Document Revised: 10/26/2011 Document Reviewed: 03/19/2008 [facility] Patient Information 2014 [place], [place].  _______________________________________________________________________

## 2019-07-29 ENCOUNTER — Other Ambulatory Visit (HOSPITAL_COMMUNITY)
Admission: RE | Admit: 2019-07-29 | Discharge: 2019-07-29 | Disposition: A | Discharge status: Home / Self Care | Payer: Medicare Other | Source: Ambulatory Visit | Attending: Orthopedic Surgery | Admitting: Orthopedic Surgery

## 2019-07-29 DIAGNOSIS — Z01812 Encounter for preprocedural laboratory examination: Secondary | ICD-10-CM | POA: Insufficient documentation

## 2019-07-29 DIAGNOSIS — Z20828 Contact with and (suspected) exposure to other viral communicable diseases: Secondary | ICD-10-CM | POA: Diagnosis not present

## 2019-07-30 LAB — NOVEL CORONAVIRUS, NAA (HOSP ORDER, SEND-OUT TO REF LAB; TAT 18-24 HRS): SARS-CoV-2, NAA: NOT DETECTED

## 2019-07-31 ENCOUNTER — Encounter (HOSPITAL_COMMUNITY)
Admission: RE | Admit: 2019-07-31 | Discharge: 2019-07-31 | Disposition: A | Discharge status: Home / Self Care | Payer: Medicare Other | Source: Ambulatory Visit | Attending: Orthopedic Surgery | Admitting: Orthopedic Surgery

## 2019-07-31 ENCOUNTER — Encounter (HOSPITAL_COMMUNITY): Payer: Self-pay

## 2019-07-31 ENCOUNTER — Other Ambulatory Visit: Payer: Self-pay

## 2019-07-31 DIAGNOSIS — I519 Heart disease, unspecified: Secondary | ICD-10-CM | POA: Insufficient documentation

## 2019-07-31 DIAGNOSIS — Z96642 Presence of left artificial hip joint: Secondary | ICD-10-CM | POA: Diagnosis not present

## 2019-07-31 DIAGNOSIS — M8580 Other specified disorders of bone density and structure, unspecified site: Secondary | ICD-10-CM | POA: Diagnosis not present

## 2019-07-31 DIAGNOSIS — M1612 Unilateral primary osteoarthritis, left hip: Secondary | ICD-10-CM | POA: Diagnosis not present

## 2019-07-31 DIAGNOSIS — E785 Hyperlipidemia, unspecified: Secondary | ICD-10-CM | POA: Diagnosis not present

## 2019-07-31 DIAGNOSIS — Z880 Allergy status to penicillin: Secondary | ICD-10-CM | POA: Diagnosis not present

## 2019-07-31 DIAGNOSIS — Z01812 Encounter for preprocedural laboratory examination: Secondary | ICD-10-CM | POA: Insufficient documentation

## 2019-07-31 DIAGNOSIS — F419 Anxiety disorder, unspecified: Secondary | ICD-10-CM | POA: Diagnosis not present

## 2019-07-31 DIAGNOSIS — Z471 Aftercare following joint replacement surgery: Secondary | ICD-10-CM | POA: Diagnosis not present

## 2019-07-31 DIAGNOSIS — Z888 Allergy status to other drugs, medicaments and biological substances status: Secondary | ICD-10-CM | POA: Diagnosis not present

## 2019-07-31 LAB — COMPREHENSIVE METABOLIC PANEL
ALT: 17 U/L (ref 0–44)
AST: 20 U/L (ref 15–41)
Albumin: 3.4 g/dL — ABNORMAL LOW (ref 3.5–5.0)
Alkaline Phosphatase: 76 U/L (ref 38–126)
Anion gap: 8 (ref 5–15)
BUN: 13 mg/dL (ref 8–23)
CO2: 25 mmol/L (ref 22–32)
Calcium: 9 mg/dL (ref 8.9–10.3)
Chloride: 108 mmol/L (ref 98–111)
Creatinine, Ser: 1.06 mg/dL — ABNORMAL HIGH (ref 0.44–1.00)
GFR calc Af Amer: 57 mL/min — ABNORMAL LOW (ref >60)
GFR calc non Af Amer: 50 mL/min — ABNORMAL LOW (ref >60)
Glucose, Bld: 111 mg/dL — ABNORMAL HIGH (ref 70–99)
Potassium: 3.8 mmol/L (ref 3.5–5.1)
Sodium: 141 mmol/L (ref 135–145)
Total Bilirubin: 0.3 mg/dL (ref 0.3–1.2)
Total Protein: 6 g/dL — ABNORMAL LOW (ref 6.5–8.1)

## 2019-07-31 LAB — PROTIME-INR
INR: 0.9 (ref 0.8–1.2)
Prothrombin Time: 12.4 seconds (ref 11.4–15.2)

## 2019-07-31 LAB — CBC
HCT: 41.3 % (ref 36.0–46.0)
Hemoglobin: 13 g/dL (ref 12.0–15.0)
MCH: 29.5 pg (ref 26.0–34.0)
MCHC: 31.5 g/dL (ref 30.0–36.0)
MCV: 93.7 fL (ref 80.0–100.0)
Platelets: 257 10*3/uL (ref 150–400)
RBC: 4.41 MIL/uL (ref 3.87–5.11)
RDW: 15.2 % (ref 11.5–15.5)
WBC: 2.5 10*3/uL — ABNORMAL LOW (ref 4.0–10.5)
nRBC: 0 % (ref 0.0–0.2)

## 2019-07-31 LAB — SURGICAL PCR SCREEN
MRSA, PCR: NEGATIVE
Staphylococcus aureus: NEGATIVE

## 2019-07-31 LAB — APTT: aPTT: 33 seconds (ref 24–36)

## 2019-07-31 NOTE — Progress Notes (Signed)
PCP - Odette Horns Cardiologist -   Chest x-ray -  EKG -  Stress Test -  ECHO -  Cardiac Cath -   Sleep Study -  CPAP -   Fasting Blood Sugar -  Checks Blood Sugar _____ times a day  Blood Thinner Instructions: Aspirin Instructions: Last Dose:  Anesthesia review:   Patient denies shortness of breath, fever, cough and chest pain at PAT appointment   Patient verbalized understanding of instructions that were given to them at the PAT appointment. Patient was also instructed that they will need to review over the PAT instructions again at home before surgery.

## 2019-08-02 ENCOUNTER — Inpatient Hospital Stay (HOSPITAL_COMMUNITY): Payer: Medicare Other

## 2019-08-02 ENCOUNTER — Inpatient Hospital Stay (HOSPITAL_COMMUNITY): Payer: Medicare Other | Admitting: Anesthesiology

## 2019-08-02 ENCOUNTER — Encounter (HOSPITAL_COMMUNITY): Payer: Self-pay | Admitting: Orthopedic Surgery

## 2019-08-02 ENCOUNTER — Inpatient Hospital Stay (HOSPITAL_COMMUNITY)
Admission: RE | Admit: 2019-08-02 | Discharge: 2019-08-03 | DRG: 470 | Disposition: A | Discharge status: Home / Self Care | Payer: Medicare Other | Attending: Orthopedic Surgery | Admitting: Orthopedic Surgery

## 2019-08-02 ENCOUNTER — Other Ambulatory Visit: Payer: Self-pay

## 2019-08-02 ENCOUNTER — Inpatient Hospital Stay (HOSPITAL_COMMUNITY): Payer: Medicare Other | Admitting: Physician Assistant

## 2019-08-02 ENCOUNTER — Encounter (HOSPITAL_COMMUNITY)
Admission: RE | Disposition: A | Discharge status: Home / Self Care | Payer: Self-pay | Source: Home / Self Care | Attending: Orthopedic Surgery

## 2019-08-02 DIAGNOSIS — Z96649 Presence of unspecified artificial hip joint: Secondary | ICD-10-CM

## 2019-08-02 DIAGNOSIS — M8580 Other specified disorders of bone density and structure, unspecified site: Secondary | ICD-10-CM | POA: Diagnosis present

## 2019-08-02 DIAGNOSIS — Z880 Allergy status to penicillin: Secondary | ICD-10-CM

## 2019-08-02 DIAGNOSIS — E785 Hyperlipidemia, unspecified: Secondary | ICD-10-CM | POA: Diagnosis present

## 2019-08-02 DIAGNOSIS — F419 Anxiety disorder, unspecified: Secondary | ICD-10-CM | POA: Diagnosis present

## 2019-08-02 DIAGNOSIS — M169 Osteoarthritis of hip, unspecified: Secondary | ICD-10-CM | POA: Diagnosis present

## 2019-08-02 DIAGNOSIS — Z419 Encounter for procedure for purposes other than remedying health state, unspecified: Secondary | ICD-10-CM

## 2019-08-02 DIAGNOSIS — M1612 Unilateral primary osteoarthritis, left hip: Principal | ICD-10-CM | POA: Diagnosis present

## 2019-08-02 DIAGNOSIS — Z888 Allergy status to other drugs, medicaments and biological substances status: Secondary | ICD-10-CM

## 2019-08-02 DIAGNOSIS — Z96642 Presence of left artificial hip joint: Secondary | ICD-10-CM

## 2019-08-02 HISTORY — PX: TOTAL HIP ARTHROPLASTY: SHX124

## 2019-08-02 LAB — TYPE AND SCREEN
ABO/RH(D): O POS
Antibody Screen: NEGATIVE

## 2019-08-02 SURGERY — ARTHROPLASTY, HIP, TOTAL, ANTERIOR APPROACH
Anesthesia: General | Site: Hip | Laterality: Left

## 2019-08-02 MED ORDER — FENTANYL CITRATE (PF) 100 MCG/2ML IJ SOLN
25.0000 ug | INTRAMUSCULAR | Status: DC | PRN
  Administered 2019-08-02: 25 ug via INTRAVENOUS
  Administered 2019-08-02 (×2): 50 ug via INTRAVENOUS
  Administered 2019-08-02: 25 ug via INTRAVENOUS

## 2019-08-02 MED ORDER — LIDOCAINE 2% (20 MG/ML) 5 ML SYRINGE
INTRAMUSCULAR | Status: DC | PRN
  Administered 2019-08-02: 60 mg via INTRAVENOUS

## 2019-08-02 MED ORDER — METHOCARBAMOL 500 MG IVPB - SIMPLE MED
500.0000 mg | Freq: Four times a day (QID) | INTRAVENOUS | Status: DC | PRN
  Administered 2019-08-02: 500 mg via INTRAVENOUS
  Filled 2019-08-02: qty 50

## 2019-08-02 MED ORDER — METHOCARBAMOL 500 MG PO TABS: 500.0000 mg | ORAL_TABLET | Freq: Four times a day (QID) | ORAL | Status: DC | PRN

## 2019-08-02 MED ORDER — ACETAMINOPHEN 10 MG/ML IV SOLN
1000.0000 mg | Freq: Four times a day (QID) | INTRAVENOUS | Status: DC
  Administered 2019-08-02: 15:00:00 1000 mg via INTRAVENOUS
  Filled 2019-08-02: qty 100

## 2019-08-02 MED ORDER — METOCLOPRAMIDE HCL 5 MG/ML IJ SOLN: 5.0000 mg | Freq: Three times a day (TID) | INTRAMUSCULAR | Status: DC | PRN

## 2019-08-02 MED ORDER — METHOCARBAMOL 500 MG IVPB - SIMPLE MED
INTRAVENOUS | Status: AC
  Filled 2019-08-02: qty 50

## 2019-08-02 MED ORDER — TRAMADOL HCL 50 MG PO TABS
50.0000 mg | ORAL_TABLET | Freq: Four times a day (QID) | ORAL | Status: DC
  Administered 2019-08-02 – 2019-08-03 (×3): 50 mg via ORAL
  Filled 2019-08-02 (×3): qty 1

## 2019-08-02 MED ORDER — TRANEXAMIC ACID-NACL 1000-0.7 MG/100ML-% IV SOLN
1000.0000 mg | INTRAVENOUS | Status: AC
  Administered 2019-08-02: 15:00:00 1000 mg via INTRAVENOUS
  Filled 2019-08-02: qty 100

## 2019-08-02 MED ORDER — METOCLOPRAMIDE HCL 5 MG PO TABS: 5.0000 mg | ORAL_TABLET | Freq: Three times a day (TID) | ORAL | Status: DC | PRN

## 2019-08-02 MED ORDER — LACTATED RINGERS IV SOLN: INTRAVENOUS | Status: DC

## 2019-08-02 MED ORDER — ROCURONIUM BROMIDE 100 MG/10ML IV SOLN
INTRAVENOUS | Status: DC | PRN
  Administered 2019-08-02: 50 mg via INTRAVENOUS
  Administered 2019-08-02: 10 mg via INTRAVENOUS

## 2019-08-02 MED ORDER — PHENOL 1.4 % MT LIQD: 1.0000 | OROMUCOSAL | Status: DC | PRN

## 2019-08-02 MED ORDER — FENTANYL CITRATE (PF) 100 MCG/2ML IJ SOLN
INTRAMUSCULAR | Status: AC
  Filled 2019-08-02: qty 2

## 2019-08-02 MED ORDER — WATER FOR IRRIGATION, STERILE IR SOLN
Status: DC | PRN
  Administered 2019-08-02: 2000 mL

## 2019-08-02 MED ORDER — ASPIRIN EC 325 MG PO TBEC
325.0000 mg | DELAYED_RELEASE_TABLET | Freq: Every day | ORAL | Status: DC
  Administered 2019-08-03: 325 mg via ORAL
  Filled 2019-08-02: qty 1

## 2019-08-02 MED ORDER — HYDROCODONE-ACETAMINOPHEN 5-325 MG PO TABS: 1.0000 | ORAL_TABLET | ORAL | Status: DC | PRN

## 2019-08-02 MED ORDER — PROPOFOL 500 MG/50ML IV EMUL
INTRAVENOUS | Status: AC
  Filled 2019-08-02: qty 50

## 2019-08-02 MED ORDER — ACETAMINOPHEN 325 MG PO TABS
325.0000 mg | ORAL_TABLET | Freq: Four times a day (QID) | ORAL | Status: DC | PRN
  Administered 2019-08-03: 650 mg via ORAL
  Filled 2019-08-02: qty 2

## 2019-08-02 MED ORDER — ONDANSETRON HCL 4 MG/2ML IJ SOLN
INTRAMUSCULAR | Status: AC
  Filled 2019-08-02: qty 2

## 2019-08-02 MED ORDER — DIPHENHYDRAMINE HCL 12.5 MG/5ML PO ELIX: 12.5000 mg | ORAL_SOLUTION | ORAL | Status: DC | PRN

## 2019-08-02 MED ORDER — BUPIVACAINE HCL (PF) 0.25 % IJ SOLN
INTRAMUSCULAR | Status: AC
  Filled 2019-08-02: qty 30

## 2019-08-02 MED ORDER — TRANEXAMIC ACID-NACL 1000-0.7 MG/100ML-% IV SOLN
1000.0000 mg | Freq: Once | INTRAVENOUS | Status: AC
  Administered 2019-08-02: 20:00:00 1000 mg via INTRAVENOUS
  Filled 2019-08-02: qty 100

## 2019-08-02 MED ORDER — ONDANSETRON HCL 4 MG/2ML IJ SOLN
INTRAMUSCULAR | Status: DC | PRN
  Administered 2019-08-02: 4 mg via INTRAVENOUS

## 2019-08-02 MED ORDER — OXYCODONE HCL 5 MG PO TABS: 5.0000 mg | ORAL_TABLET | Freq: Once | ORAL | Status: DC | PRN

## 2019-08-02 MED ORDER — CEFAZOLIN SODIUM-DEXTROSE 2-4 GM/100ML-% IV SOLN
2.0000 g | INTRAVENOUS | Status: AC
  Administered 2019-08-02: 2 g via INTRAVENOUS
  Filled 2019-08-02: qty 100

## 2019-08-02 MED ORDER — DEXAMETHASONE SODIUM PHOSPHATE 10 MG/ML IJ SOLN
INTRAMUSCULAR | Status: DC | PRN
  Administered 2019-08-02: 8 mg via INTRAVENOUS

## 2019-08-02 MED ORDER — PROPOFOL 500 MG/50ML IV EMUL
INTRAVENOUS | Status: DC | PRN
  Administered 2019-08-02: 120 mg via INTRAVENOUS

## 2019-08-02 MED ORDER — FERROUS SULFATE 325 (65 FE) MG PO TABS
325.0000 mg | ORAL_TABLET | Freq: Every day | ORAL | Status: DC
  Administered 2019-08-03: 08:00:00 325 mg via ORAL
  Filled 2019-08-02: qty 1

## 2019-08-02 MED ORDER — 0.9 % SODIUM CHLORIDE (POUR BTL) OPTIME
TOPICAL | Status: DC | PRN
  Administered 2019-08-02: 1000 mL

## 2019-08-02 MED ORDER — BISACODYL 10 MG RE SUPP: 10.0000 mg | Freq: Every day | RECTAL | Status: DC | PRN

## 2019-08-02 MED ORDER — SUGAMMADEX SODIUM 200 MG/2ML IV SOLN
INTRAVENOUS | Status: DC | PRN
  Administered 2019-08-02: 200 mg via INTRAVENOUS

## 2019-08-02 MED ORDER — CEFAZOLIN SODIUM-DEXTROSE 2-4 GM/100ML-% IV SOLN
2.0000 g | Freq: Four times a day (QID) | INTRAVENOUS | Status: AC
  Administered 2019-08-02 – 2019-08-03 (×2): 2 g via INTRAVENOUS
  Filled 2019-08-02 (×2): qty 100

## 2019-08-02 MED ORDER — DOCUSATE SODIUM 100 MG PO CAPS
100.0000 mg | ORAL_CAPSULE | Freq: Two times a day (BID) | ORAL | Status: DC
  Administered 2019-08-02 – 2019-08-03 (×2): 100 mg via ORAL
  Filled 2019-08-02 (×2): qty 1

## 2019-08-02 MED ORDER — DEXAMETHASONE SODIUM PHOSPHATE 10 MG/ML IJ SOLN: 8.0000 mg | Freq: Once | INTRAMUSCULAR | Status: DC

## 2019-08-02 MED ORDER — DEXAMETHASONE SODIUM PHOSPHATE 10 MG/ML IJ SOLN
INTRAMUSCULAR | Status: AC
  Filled 2019-08-02: qty 1

## 2019-08-02 MED ORDER — ONDANSETRON HCL 4 MG PO TABS: 4.0000 mg | ORAL_TABLET | Freq: Four times a day (QID) | ORAL | Status: DC | PRN

## 2019-08-02 MED ORDER — FENTANYL CITRATE (PF) 100 MCG/2ML IJ SOLN
INTRAMUSCULAR | Status: DC | PRN
  Administered 2019-08-02 (×3): 50 ug via INTRAVENOUS

## 2019-08-02 MED ORDER — ONDANSETRON HCL 4 MG/2ML IJ SOLN: 4.0000 mg | Freq: Once | INTRAMUSCULAR | Status: DC | PRN

## 2019-08-02 MED ORDER — ROCURONIUM BROMIDE 10 MG/ML (PF) SYRINGE
PREFILLED_SYRINGE | INTRAVENOUS | Status: AC
  Filled 2019-08-02: qty 10

## 2019-08-02 MED ORDER — ONDANSETRON HCL 4 MG/2ML IJ SOLN: 4.0000 mg | Freq: Four times a day (QID) | INTRAMUSCULAR | Status: DC | PRN

## 2019-08-02 MED ORDER — MORPHINE SULFATE (PF) 4 MG/ML IV SOLN: 0.5000 mg | INTRAVENOUS | Status: DC | PRN

## 2019-08-02 MED ORDER — MENTHOL 3 MG MT LOZG: 1.0000 | LOZENGE | OROMUCOSAL | Status: DC | PRN

## 2019-08-02 MED ORDER — POLYETHYLENE GLYCOL 3350 17 G PO PACK: 17.0000 g | PACK | Freq: Every day | ORAL | Status: DC | PRN

## 2019-08-02 MED ORDER — OXYCODONE HCL 5 MG/5ML PO SOLN: 5.0000 mg | Freq: Once | ORAL | Status: DC | PRN

## 2019-08-02 MED ORDER — SODIUM CHLORIDE 0.9 % IV SOLN: INTRAVENOUS | Status: DC

## 2019-08-02 MED ORDER — DEXAMETHASONE SODIUM PHOSPHATE 10 MG/ML IJ SOLN
10.0000 mg | Freq: Once | INTRAMUSCULAR | Status: AC
  Administered 2019-08-03: 10 mg via INTRAVENOUS
  Filled 2019-08-02: qty 1

## 2019-08-02 MED ORDER — MAGNESIUM CITRATE PO SOLN: 1.0000 | Freq: Once | ORAL | Status: DC | PRN

## 2019-08-02 MED ORDER — BUPIVACAINE HCL 0.25 % IJ SOLN
INTRAMUSCULAR | Status: DC | PRN
  Administered 2019-08-02: 30 mL via INTRA_ARTICULAR

## 2019-08-02 MED ORDER — CHLORHEXIDINE GLUCONATE 4 % EX LIQD: 60.0000 mL | Freq: Once | CUTANEOUS | Status: DC

## 2019-08-02 SURGICAL SUPPLY — 47 items
BAG DECANTER FOR FLEXI CONT (MISCELLANEOUS) IMPLANT
BAG SPEC THK2 15X12 ZIP CLS (MISCELLANEOUS)
BAG ZIPLOCK 12X15 (MISCELLANEOUS) IMPLANT
BLADE SAG 18X100X1.27 (BLADE) ×2 IMPLANT
COVER PERINEAL POST (MISCELLANEOUS) ×2 IMPLANT
COVER SURGICAL LIGHT HANDLE (MISCELLANEOUS) ×2 IMPLANT
COVER WAND RF STERILE (DRAPES) IMPLANT
CUP ACET PINNACLE SECTR 50MM (Hips) IMPLANT
DECANTER SPIKE VIAL GLASS SM (MISCELLANEOUS) ×2 IMPLANT
DRAPE STERI IOBAN 125X83 (DRAPES) ×2 IMPLANT
DRAPE U-SHAPE 47X51 STRL (DRAPES) ×4 IMPLANT
DRSG ADAPTIC 3X8 NADH LF (GAUZE/BANDAGES/DRESSINGS) ×2 IMPLANT
DRSG MEPILEX BORDER 4X4 (GAUZE/BANDAGES/DRESSINGS) ×2 IMPLANT
DRSG MEPILEX BORDER 4X8 (GAUZE/BANDAGES/DRESSINGS) ×2 IMPLANT
DURAPREP 26ML APPLICATOR (WOUND CARE) ×2 IMPLANT
ELECT REM PT RETURN 15FT ADLT (MISCELLANEOUS) ×2 IMPLANT
EVACUATOR 1/8 PVC DRAIN (DRAIN) ×2 IMPLANT
GLOVE BIO SURGEON STRL SZ 6 (GLOVE) IMPLANT
GLOVE BIO SURGEON STRL SZ7 (GLOVE) IMPLANT
GLOVE BIO SURGEON STRL SZ8 (GLOVE) ×2 IMPLANT
GLOVE BIOGEL PI IND STRL 6.5 (GLOVE) IMPLANT
GLOVE BIOGEL PI IND STRL 7.0 (GLOVE) IMPLANT
GLOVE BIOGEL PI IND STRL 8 (GLOVE) ×1 IMPLANT
GLOVE BIOGEL PI INDICATOR 6.5 (GLOVE)
GLOVE BIOGEL PI INDICATOR 7.0 (GLOVE)
GLOVE BIOGEL PI INDICATOR 8 (GLOVE) ×1
GOWN STRL REUS W/TWL LRG LVL3 (GOWN DISPOSABLE) ×2 IMPLANT
GOWN STRL REUS W/TWL XL LVL3 (GOWN DISPOSABLE) IMPLANT
HEAD FEM STD 32X+1 STRL (Hips) ×1 IMPLANT
HOLDER FOLEY CATH W/STRAP (MISCELLANEOUS) ×2 IMPLANT
KIT TURNOVER KIT A (KITS) IMPLANT
LINER MARATHON 32 50 (Hips) ×1 IMPLANT
MANIFOLD NEPTUNE II (INSTRUMENTS) ×2 IMPLANT
PACK ANTERIOR HIP CUSTOM (KITS) ×2 IMPLANT
PENCIL SMOKE EVACUATOR COATED (MISCELLANEOUS) ×1 IMPLANT
PINNACLE SECTOR CUP 50MM (Hips) ×2 IMPLANT
STEM FEMORAL SZ6 HIGH ACTIS (Stem) ×1 IMPLANT
STRIP CLOSURE SKIN 1/2X4 (GAUZE/BANDAGES/DRESSINGS) ×3 IMPLANT
SUT ETHIBOND NAB CT1 #1 30IN (SUTURE) ×2 IMPLANT
SUT MNCRL AB 4-0 PS2 18 (SUTURE) ×2 IMPLANT
SUT STRATAFIX 0 PDS 27 VIOLET (SUTURE) ×2
SUT VIC AB 2-0 CT1 27 (SUTURE) ×4
SUT VIC AB 2-0 CT1 TAPERPNT 27 (SUTURE) ×2 IMPLANT
SUTURE STRATFX 0 PDS 27 VIOLET (SUTURE) ×1 IMPLANT
SYR 50ML LL SCALE MARK (SYRINGE) IMPLANT
TRAY FOLEY MTR SLVR 16FR STAT (SET/KITS/TRAYS/PACK) ×2 IMPLANT
YANKAUER SUCT BULB TIP 10FT TU (MISCELLANEOUS) ×2 IMPLANT

## 2019-08-02 NOTE — Anesthesia Postprocedure Evaluation (Signed)
Anesthesia Post Note  Patient: Morgan [name]  Procedure(s) Performed: TOTAL HIP ARTHROPLASTY ANTERIOR APPROACH (Left Hip)     Patient location during evaluation: PACU Anesthesia Type: General Level of consciousness: awake and alert Pain management: pain level controlled Vital Signs Assessment: post-procedure vital signs reviewed and stable Respiratory status: spontaneous breathing, nonlabored ventilation and respiratory function stable Cardiovascular status: blood pressure returned to baseline and stable Postop Assessment: no apparent nausea or vomiting Anesthetic complications: no    Last Vitals:  Vitals:   08/02/19 1752 08/02/19 2006  BP: (!) 107/92 (!) 168/73  Pulse: 65 64  Resp: 15 16  Temp: 36.9 C 36.8 C  SpO2: 100% 100%    Last Pain:  Vitals:   08/02/19 2006  TempSrc: Oral  PainSc:                  Lidia Collum

## 2019-08-02 NOTE — Plan of Care (Signed)
Continue current POC 

## 2019-08-02 NOTE — Discharge Instructions (Signed)
Dr. Gaynelle Arabian Total Joint Specialist [facility] [address] [place], [place] [address] [phone]  ANTERIOR APPROACH TOTAL HIP REPLACEMENT POSTOPERATIVE DIRECTIONS   Hip Rehabilitation, Guidelines Following Surgery  The results of a hip operation are greatly improved after range of motion and muscle strengthening exercises. Follow all safety measures which are given to protect your hip. If any of these exercises cause increased pain or swelling in your joint, decrease the amount until you are comfortable again. Then slowly increase the exercises. Call your caregiver if you have problems or questions.   HOME CARE INSTRUCTIONS  . Remove items at home which could result in a fall. This includes throw rugs or furniture in walking pathways.   ICE to the affected hip every three hours for 30 minutes at a time and then as needed for pain and swelling.  Continue to use ice on the hip for pain and swelling from surgery. You may notice swelling that will progress down to the foot and ankle.  This is normal after surgery.  Elevate the leg when you are not up walking on it.    Continue to use the breathing machine which will help keep your temperature down.  It is common for your temperature to cycle up and down following surgery, especially at night when you are not up moving around and exerting yourself.  The breathing machine keeps your lungs expanded and your temperature down.  DIET You may resume your previous home diet once your are discharged from the hospital.  DRESSING / WOUND CARE / SHOWERING You may change your dressing 3-5 days after surgery.  Then change the dressing every day with sterile gauze.  Please use good hand washing techniques before changing the dressing.  Do not use any lotions or creams on the incision until instructed by your surgeon. You may start showering once you are discharged home but do not submerge the incision under water. Just pat the  incision dry and apply a dry gauze dressing on daily. Change the surgical dressing daily and reapply a dry dressing each time.  ACTIVITY Walk with your walker as instructed. Use walker as long as suggested by your caregivers. Avoid periods of inactivity such as sitting longer than an hour when not asleep. This helps prevent blood clots.  You may resume a sexual relationship in one month or when given the OK by your doctor.  You may return to work once you are cleared by your doctor.  Do not drive a car for 6 weeks or until released by you surgeon.  Do not drive while taking narcotics.  WEIGHT BEARING Weight bearing as tolerated with assist device (walker, cane, etc) as directed, use it as long as suggested by your surgeon or therapist, typically at least 4-6 weeks.  POSTOPERATIVE CONSTIPATION PROTOCOL Constipation - defined medically as fewer than three stools per week and severe constipation as less than one stool per week.  One of the most common issues patients have following surgery is constipation.  Even if you have a regular bowel pattern at home, your normal regimen is likely to be disrupted due to multiple reasons following surgery.  Combination of anesthesia, postoperative narcotics, change in appetite and fluid intake all can affect your bowels.  In order to avoid complications following surgery, here are some recommendations in order to help you during your recovery period.  Colace (docusate) - Pick up an over-the-counter form of Colace or another stool softener and take twice a day  as long as you are requiring postoperative pain medications.  Take with a full glass of water daily.  If you experience loose stools or diarrhea, hold the colace until you stool forms back up.  If your symptoms do not get better within 1 week or if they get worse, check with your doctor.  Dulcolax (bisacodyl) - Pick up over-the-counter and take as directed by the product packaging as needed to assist with  the movement of your bowels.  Take with a full glass of water.  Use this product as needed if not relieved by Colace only.   MiraLax (polyethylene glycol) - Pick up over-the-counter to have on hand.  MiraLax is a solution that will increase the amount of water in your bowels to assist with bowel movements.  Take as directed and can mix with a glass of water, juice, soda, coffee, or tea.  Take if you go more than two days without a movement. Do not use MiraLax more than once per day. Call your doctor if you are still constipated or irregular after using this medication for 7 days in a row.  If you continue to have problems with postoperative constipation, please contact the office for further assistance and recommendations.  If you experience "the worst abdominal pain ever" or develop nausea or vomiting, please contact the office immediatly for further recommendations for treatment.  ITCHING  If you experience itching with your medications, try taking only a single pain pill, or even half a pain pill at a time.  You can also use Benadryl over the counter for itching or also to help with sleep.   TED HOSE STOCKINGS Wear the elastic stockings on both legs for three weeks following surgery during the day but you may remove then at night for sleeping.  MEDICATIONS See your medication summary on the "After Visit Summary" that the nursing staff will review with you prior to discharge.  You may have some home medications which will be placed on hold until you complete the course of blood thinner medication.  It is important for you to complete the blood thinner medication as prescribed by your surgeon.  Continue your approved medications as instructed at time of discharge.  PRECAUTIONS If you experience chest pain or shortness of breath - call 911 immediately for transfer to the hospital emergency department.  If you develop a fever greater that 101 F, purulent drainage from wound, increased redness or  drainage from wound, foul odor from the wound/dressing, or calf pain - CONTACT YOUR SURGEON.                                                   FOLLOW-UP APPOINTMENTS Make sure you keep all of your appointments after your operation with your surgeon and caregivers. You should call the office at the above phone number and make an appointment for approximately two weeks after the date of your surgery or on the date instructed by your surgeon outlined in the "After Visit Summary".  RANGE OF MOTION AND STRENGTHENING EXERCISES  These exercises are designed to help you keep full movement of your hip joint. Follow your caregiver's or physical therapist's instructions. Perform all exercises about fifteen times, three times per day or as directed. Exercise both hips, even if you have had only one joint replacement. These exercises can be done on  a training (exercise) mat, on the floor, on a table or on a bed. Use whatever works the best and is most comfortable for you. Use music or television while you are exercising so that the exercises are a pleasant break in your day. This will make your life better with the exercises acting as a break in routine you can look forward to.  . Lying on your back, slowly slide your foot toward your buttocks, raising your knee up off the floor. Then slowly slide your foot back down until your leg is straight again.  . Lying on your back spread your legs as far apart as you can without causing discomfort.  . Lying on your side, raise your upper leg and foot straight up from the floor as far as is comfortable. Slowly lower the leg and repeat.  . Lying on your back, tighten up the muscle in the front of your thigh (quadriceps muscles). You can do this by keeping your leg straight and trying to raise your heel off the floor. This helps strengthen the largest muscle supporting your knee.  . Lying on your back, tighten up the muscles of your buttocks both with the legs straight and with  the knee bent at a comfortable angle while keeping your heel on the floor.   IF YOU ARE TRANSFERRED TO A SKILLED REHAB FACILITY If the patient is transferred to a skilled rehab facility following release from the hospital, a list of the current medications will be sent to the facility for the patient to continue.  When discharged from the skilled rehab facility, please have the facility set up the patient's [place] prior to being released. Also, the skilled facility will be responsible for providing the patient with their medications at time of release from the facility to include their pain medication, the muscle relaxants, and their blood thinner medication. If the patient is still at the rehab facility at time of the two week follow up appointment, the skilled rehab facility will also need to assist the patient in arranging follow up appointment in our office and any transportation needs.  MAKE SURE YOU:  . Understand these instructions.  . Get help right away if you are not doing well or get worse.    Pick up stool softner and laxative for home use following surgery while on pain medications. Do not submerge incision under water. Please use good hand washing techniques while changing dressing each day. May shower starting three days after surgery. Please use a clean towel to pat the incision dry following showers. Continue to use ice for pain and swelling after surgery. Do not use any lotions or creams on the incision until instructed by your surgeon.

## 2019-08-02 NOTE — Plan of Care (Signed)
Plan of care 

## 2019-08-02 NOTE — Interval H&P Note (Signed)
History and Physical Interval Note:  08/02/2019 1:21 PM  Morgan [name]  has presented today for surgery, with the diagnosis of Left hip osteoarthritis.  The various methods of treatment have been discussed with the patient and family. After consideration of risks, benefits and other options for treatment, the patient has consented to  Procedure(s) with comments: TOTAL HIP ARTHROPLASTY ANTERIOR APPROACH (Left) - 100 mins as a surgical intervention.  The patient's history has been reviewed, patient examined, no change in status, stable for surgery.  I have reviewed the patient's chart and labs.  Questions were answered to the patient's satisfaction.     Pilar Plate Charice Zuno

## 2019-08-02 NOTE — Anesthesia Preprocedure Evaluation (Addendum)
Anesthesia Evaluation  Patient identified by MRN, date of birth, ID band Patient awake    Reviewed: Allergy & Precautions, NPO status , Patient's Chart, lab work & pertinent test results  History of Anesthesia Complications Negative for: history of anesthetic complications  Airway Mallampati: II  TM Distance: >3 FB Neck ROM: Full    Dental  (+) Upper Dentures, Lower Dentures   Pulmonary neg pulmonary ROS,    Pulmonary exam normal        Cardiovascular negative cardio ROS Normal cardiovascular exam     Neuro/Psych negative neurological ROS  negative psych ROS   GI/Hepatic negative GI ROS, Neg liver ROS,   Endo/Other  negative endocrine ROS  Renal/GU negative Renal ROS  negative genitourinary   Musculoskeletal  (+) Arthritis , Osteoarthritis,    Abdominal   Peds  Hematology negative hematology ROS (+)   Anesthesia Other Findings   Reproductive/Obstetrics                           Anesthesia Physical Anesthesia Plan  ASA: II  Anesthesia Plan: General   Post-op Pain Management:    Induction: Intravenous  PONV Risk Score and Plan: 3 and Ondansetron, Dexamethasone and Treatment may vary due to age or medical condition  Airway Management Planned: Oral ETT  Additional Equipment: None  Intra-op Plan:   Post-operative Plan: Extubation in OR  Informed Consent: I have reviewed the patients History and Physical, chart, labs and discussed the procedure including the risks, benefits and alternatives for the proposed anesthesia with the patient or authorized representative who has indicated his/her understanding and acceptance.     Dental advisory given  Plan Discussed with:   Anesthesia Plan Comments:         Anesthesia Quick Evaluation

## 2019-08-02 NOTE — Transfer of Care (Signed)
Immediate Anesthesia Transfer of Care Note  Patient: Morgan [name]  Procedure(s) Performed: TOTAL HIP ARTHROPLASTY ANTERIOR APPROACH (Left Hip)  Patient Location: PACU  Anesthesia Type:General  Level of Consciousness: drowsy, patient cooperative and responds to stimulation  Airway & Oxygen Therapy: Patient Spontanous Breathing and Patient connected to face mask oxygen  Post-op Assessment: Report given to RN and Post -op Vital signs reviewed and stable  Post vital signs: Reviewed and stable  Last Vitals:  Vitals Value Taken Time  BP 138/72 08/02/19 1604  Temp    Pulse 67 08/02/19 1606  Resp 16 08/02/19 1606  SpO2 100 % 08/02/19 1606  Vitals shown include unvalidated device data.  Last Pain:  Vitals:   08/02/19 1352  TempSrc:   PainSc: 0-No pain      Patients Stated Pain Goal: 3 ([id number])  Complications: No apparent anesthesia complications

## 2019-08-02 NOTE — Anesthesia Procedure Notes (Signed)
Procedure Name: Intubation Date/Time: 08/02/2019 2:30 PM Performed by: Glory Buff, CRNA Pre-anesthesia Checklist: Patient identified, Emergency Drugs available, Suction available and Patient being monitored Patient Re-evaluated:Patient Re-evaluated prior to induction Oxygen Delivery Method: Circle system utilized Preoxygenation: Pre-oxygenation with 100% oxygen Induction Type: IV induction Ventilation: Mask ventilation without difficulty Laryngoscope Size: Miller and 3 Grade View: Grade I Tube type: Oral Tube size: 7.0 mm Number of attempts: 1 Airway Equipment and Method: Stylet and Oral airway Placement Confirmation: ETT inserted through vocal cords under direct vision,  positive ETCO2 and breath sounds checked- equal and bilateral Secured at: 21 cm Tube secured with: Tape Dental Injury: Teeth and Oropharynx as per pre-operative assessment

## 2019-08-02 NOTE — Op Note (Signed)
OPERATIVE REPORT- TOTAL HIP ARTHROPLASTY   PREOPERATIVE DIAGNOSIS: Osteoarthritis of the Left hip.   POSTOPERATIVE DIAGNOSIS: Osteoarthritis of the Left  hip.   PROCEDURE: Left total hip arthroplasty, anterior approach.   SURGEON: Gaynelle Arabian, MD   ASSISTANT: Griffith Citron, PA-C  ANESTHESIA:  General  ESTIMATED BLOOD LOSS:-250 mL    DRAINS: Hemovac x1.   COMPLICATIONS: None   CONDITION: PACU - hemodynamically stable.   BRIEF CLINICAL NOTE: Morgan [name] is a 78 y.o. female who has advanced end-  stage arthritis of their Left  hip with progressively worsening pain and  dysfunction.The patient has failed nonoperative management and presents for  total hip arthroplasty.   PROCEDURE IN DETAIL: After successful administration of spinal  anesthetic, the traction boots for the [facility] bed were placed on both  feet and the patient was placed onto the [facility] bed, boots placed into the leg  holders. The Left hip was then isolated from the perineum with plastic  drapes and prepped and draped in the usual sterile fashion. ASIS and  greater trochanter were marked and a oblique incision was made, starting  at about 1 cm lateral and 2 cm distal to the ASIS and coursing towards  the anterior cortex of the femur. The skin was cut with a 10 blade  through subcutaneous tissue to the level of the fascia overlying the  tensor fascia lata muscle. The fascia was then incised in line with the  incision at the junction of the anterior third and posterior 2/3rd. The  muscle was teased off the fascia and then the interval between the TFL  and the rectus was developed. The Hohmann retractor was then placed at  the top of the femoral neck over the capsule. The vessels overlying the  capsule were cauterized and the fat on top of the capsule was removed.  A Hohmann retractor was then placed anterior underneath the rectus  femoris to give exposure to the entire anterior capsule. A T-shaped   capsulotomy was performed. The edges were tagged and the femoral head  was identified.       Osteophytes are removed off the superior acetabulum.  The femoral neck was then cut in situ with an oscillating saw. Traction  was then applied to the left lower extremity utilizing the [facility]  traction. The femoral head was then removed. Retractors were placed  around the acetabulum and then circumferential removal of the labrum was  performed. Osteophytes were also removed. Reaming starts at 47 mm to  medialize and  Increased in 2 mm increments to 49 mm. We reamed in  approximately 40 degrees of abduction, 20 degrees anteversion. A 50 mm  pinnacle acetabular shell was then impacted in anatomic position under  fluoroscopic guidance with excellent purchase. We did not need to place  any additional dome screws. A 32 mm neutral + 4 marathon liner was then  placed into the acetabular shell.       The femoral lift was then placed along the lateral aspect of the femur  just distal to the vastus ridge. The leg was  externally rotated and capsule  was stripped off the inferior aspect of the femoral neck down to the  level of the lesser trochanter, this was done with electrocautery. The femur was lifted after this was performed. The  leg was then placed in an extended and adducted position essentially delivering the femur. We also removed the capsule superiorly and the piriformis from the piriformis  fossa to gain excellent exposure of the  proximal femur. Rongeur was used to remove some cancellous bone to get  into the lateral portion of the proximal femur for placement of the  initial starter reamer. The starter broaches was placed  the starter broach  and was shown to go down the center of the canal. Broaching  with the Actis system was then performed starting at size 0  coursing  Up to size 5. A size 5 had excellent torsional and rotational  and axial stability. The trial high offset neck was then placed   with a 32 + 1 trial head. The hip was then reduced. We confirmed that  the stem was in the canal both on AP and lateral x-rays. It also has excellent sizing. The hip was reduced with outstanding stability through full extension and full external rotation.. AP pelvis was taken and the leg lengths were measured and found to be equal. Hip was then dislocated again and the femoral head and neck removed. The  femoral broach was removed. Size 5 Actis stem with a high offset  neck was then impacted into the femur following native anteversion. Has  excellent purchase in the canal. Excellent torsional and rotational and  axial stability. It is confirmed to be in the canal on AP and lateral  fluoroscopic views. The 32 + 1 metal head was placed and the hip  reduced with outstanding stability. Again AP pelvis was taken and it  confirmed that the leg lengths were equal. The wound was then copiously  irrigated with saline solution and the capsule reattached and repaired  with Ethibond suture. 30 ml of .25% Bupivicaine was  injected into the capsule and into the edge of the tensor fascia lata as well as subcutaneous tissue. The fascia overlying the tensor fascia lata was then closed with a running #1 V-Loc. Subcu was closed with interrupted 2-0 Vicryl and subcuticular running 4-0 Monocryl. Incision was cleaned  and dried. Steri-Strips and a bulky sterile dressing applied. Hemovac  drain was hooked to suction and then the patient was awakened and transported to  recovery in stable condition.        Please note that a surgical assistant was a medical necessity for this procedure to perform it in a safe and expeditious manner. Assistant was necessary to provide appropriate retraction of vital neurovascular structures and to prevent femoral fracture and allow for anatomic placement of the prosthesis.  Gaynelle Arabian, M.D.

## 2019-08-03 ENCOUNTER — Encounter: Payer: Self-pay | Admitting: *Deleted

## 2019-08-03 LAB — BASIC METABOLIC PANEL
Anion gap: 7 (ref 5–15)
BUN: 16 mg/dL (ref 8–23)
CO2: 25 mmol/L (ref 22–32)
Calcium: 8.8 mg/dL — ABNORMAL LOW (ref 8.9–10.3)
Chloride: 103 mmol/L (ref 98–111)
Creatinine, Ser: 1.03 mg/dL — ABNORMAL HIGH (ref 0.44–1.00)
GFR calc Af Amer: 59 mL/min — ABNORMAL LOW (ref >60)
GFR calc non Af Amer: 51 mL/min — ABNORMAL LOW (ref >60)
Glucose, Bld: 161 mg/dL — ABNORMAL HIGH (ref 70–99)
Potassium: 4.7 mmol/L (ref 3.5–5.1)
Sodium: 135 mmol/L (ref 135–145)

## 2019-08-03 LAB — CBC
HCT: 38.1 % (ref 36.0–46.0)
Hemoglobin: 11.7 g/dL — ABNORMAL LOW (ref 12.0–15.0)
MCH: 28.6 pg (ref 26.0–34.0)
MCHC: 30.7 g/dL (ref 30.0–36.0)
MCV: 93.2 fL (ref 80.0–100.0)
Platelets: 238 10*3/uL (ref 150–400)
RBC: 4.09 MIL/uL (ref 3.87–5.11)
RDW: 14.7 % (ref 11.5–15.5)
WBC: 7.2 10*3/uL (ref 4.0–10.5)
nRBC: 0 % (ref 0.0–0.2)

## 2019-08-03 MED ORDER — METHOCARBAMOL 500 MG PO TABS: 500.0000 mg | ORAL_TABLET | Freq: Four times a day (QID) | ORAL | 0 refills | Status: DC | PRN

## 2019-08-03 MED ORDER — ASPIRIN 325 MG PO TBEC: 325.0000 mg | DELAYED_RELEASE_TABLET | Freq: Two times a day (BID) | ORAL | 0 refills | Status: DC

## 2019-08-03 MED ORDER — HYDROCODONE-ACETAMINOPHEN 5-325 MG PO TABS: 1.0000 | ORAL_TABLET | Freq: Four times a day (QID) | ORAL | 0 refills | Status: DC | PRN

## 2019-08-03 MED ORDER — TRAMADOL HCL 50 MG PO TABS: 50.0000 mg | ORAL_TABLET | Freq: Four times a day (QID) | ORAL | 0 refills | Status: DC | PRN

## 2019-08-03 NOTE — Progress Notes (Signed)
Physical Therapy Treatment Patient Details Name: Morgan [name] MRN: [id number] DOB: 1941/02/20 Today's Date: 08/03/2019    History of Present Illness Pt s/p L THR and with hx of MVP, DJD, and R THR by posterior approach in 2010    PT Comments    POD # 1 pm session Assisted to bathroom then amb in hallway.  Practiced 3 steps one L rail.  General stair comments: 50% VC's on proper sequencing and safety.  Then returned to room to perform some TE's following HEP handout.  Instructed on proper tech, freq as well as use of ICE.   Addressed all mobility questions, discussed appropriate activity, educated on use of ICE.  Pt ready for D/C to home.   Follow Up Recommendations  Follow surgeon's recommendation for DC plan and follow-up therapies     Equipment Recommendations  None recommended by PT    Recommendations for Other Services       Precautions / Restrictions Precautions Precautions: Fall Restrictions Weight Bearing Restrictions: No Other Position/Activity Restrictions: WBAT    Mobility  Bed Mobility Overal bed mobility: Needs Assistance Bed Mobility: Sit to Supine     Supine to sit: Min assist Sit to supine: Min assist   General bed mobility comments: demonstarted and instructed how to use a belt to self assist LE onto bed.  Transfers Overall transfer level: Needs assistance Equipment used: Rolling walker (2 wheeled) Transfers: Sit to/from [facility] Sit to Stand: Supervision;Min guard Stand pivot transfers: Supervision;Min guard       General transfer comment: 25% VC's on safety with turns  Ambulation/Gait Ambulation/Gait assistance: Min guard Gait Distance (Feet): 55 Feet Assistive device: Rolling walker (2 wheeled) Gait Pattern/deviations: Step-to pattern;Step-through pattern;Decreased step length - right;Decreased step length - left;Shuffle;Trunk flexed Gait velocity: decr   General Gait Details: decreased amb distance due to fatige  after amb to bathroom.   Stairs Stairs: Yes Stairs assistance: Supervision;Min guard Stair Management: One rail Left;Sideways Number of Stairs: 3 General stair comments: 50% VC's on proper sequencing and safety   Wheelchair Mobility    Modified Rankin (Stroke Patients Only)       Balance Overall balance assessment: Mild deficits observed, not formally tested                                          Cognition Arousal/Alertness: Awake/alert Behavior During Therapy: WFL for tasks assessed/performed Overall Cognitive Status: Within Functional Limits for tasks assessed                                 General Comments: pleasant and hyper talkative.  Required redirection to stay on task.      Exercises 5 reps of all standing TE's following HEP handout.  Instructed on proper tech.    General Comments        Pertinent Vitals/Pain Pain Assessment: 0-10 Pain Score: 5  Pain Location: L hip Pain Descriptors / Indicators: Aching;Sore;Tender;Tightness Pain Intervention(s): Monitored during session;Premedicated before session;Repositioned    Home Living Family/patient expects to be discharged to:: Private residence Living Arrangements: Alone Available Help at Discharge: Family;Available 24 hours/day Type of Home: House Home Access: Stairs to enter Entrance Stairs-Rails: Right Home Layout: One level Home Equipment: Walker - 2 wheels;Cane - single point;Bedside commode      Prior Function Level of Independence:  Independent          PT Goals (current goals can now be found in the care plan section) Acute Rehab PT Goals Patient Stated Goal: Regain IND PT Goal Formulation: With patient Time For Goal Achievement: 08/10/19 Potential to Achieve Goals: Good Progress towards PT goals: Progressing toward goals    Frequency    7X/week      PT Plan Current plan remains appropriate    Co-evaluation              AM-PAC PT "6  Clicks" Mobility   Outcome Measure  Help needed turning from your back to your side while in a flat bed without using bedrails?: A Little Help needed moving from lying on your back to sitting on the side of a flat bed without using bedrails?: A Little Help needed moving to and from a bed to a chair (including a wheelchair)?: A Little Help needed standing up from a chair using your arms (e.g., wheelchair or bedside chair)?: A Little Help needed to walk in hospital room?: A Little Help needed climbing 3-5 steps with a railing? : A Little 6 Click Score: 18    End of Session Equipment Utilized During Treatment: Gait belt Activity Tolerance: Patient tolerated treatment well Patient left: in bed;with call bell/phone within reach;with bed alarm set Nurse Communication: Mobility status(pt ready for D/C to home) PT Visit Diagnosis: Difficulty in walking, not elsewhere classified (R26.2)     Time: [id number] PT Time Calculation (min) (ACUTE ONLY): 25 min  Charges:  $Gait Training: 8-22 mins $Therapeutic Exercise: 8-22 mins                     Rica Koyanagi  PTA Acute  Rehabilitation Services Pager      [phone] Office      [phone]

## 2019-08-03 NOTE — Progress Notes (Signed)
   Subjective: 1 Day Post-Op Procedure(s) (LRB): TOTAL HIP ARTHROPLASTY ANTERIOR APPROACH (Left) Patient reports pain as mild.   Patient seen in rounds with Dr. Wynelle Link. Patient is well, and has had no acute complaints or problems. Mild discomfort in the left hip. Reports she slept well last night. No SOB or chest pain. Voiding well. Positive flatus.  Plan is to go Home after hospital stay.  Objective: Vital signs in last 24 hours: Temp:  [97.8 F (36.6 C)-99 F (37.2 C)] 98.1 F (36.7 C) (12/17 0446) Pulse Rate:  [60-72] 72 (12/17 0446) Resp:  [10-16] 16 (12/17 0446) BP: (106-168)/(48-92) 152/78 (12/17 0446) SpO2:  [98 %-100 %] 99 % (12/17 0446) Weight:  [84 kg-84.1 kg] 84 kg (12/16 1800)  Intake/Output from previous day:  Intake/Output Summary (Last 24 hours) at 08/03/2019 0734 Last data filed at 08/03/2019 0554 Gross per 24 hour  Intake 2900 ml  Output 1175 ml  Net 1725 ml     Labs: Recent Labs    07/31/19 1354 08/03/19 0239  HGB 13.0 11.7*   Recent Labs    07/31/19 1354 08/03/19 0239  WBC 2.5* 7.2  RBC 4.41 4.09  HCT 41.3 38.1  PLT 257 238   Recent Labs    07/31/19 1354 08/03/19 0239  NA 141 135  K 3.8 4.7  CL 108 103  CO2 25 25  BUN 13 16  CREATININE 1.06* 1.03*  GLUCOSE 111* 161*  CALCIUM 9.0 8.8*   Recent Labs    07/31/19 1354  INR 0.9    EXAM General - Patient is Alert and Oriented Extremity - Neurologically intact Intact pulses distally Dorsiflexion/Plantar flexion intact No cellulitis present Compartment soft Dressing - dressing C/D/I Motor Function - intact, moving foot and toes well on exam.  Hemovac pulled without difficulty.  Past Medical History:  Diagnosis Date  . Allergy history unknown   . Anxiety   . Complication of anesthesia    during second C-section- felt like top of head was being pulled off.  . DJD (degenerative joint disease)   . History of hematuria   . Hx of colonic polyps   . Hyperlipidemia   . Mitral  valve prolapse   . Osteopenia   . Venous insufficiency     Assessment/Plan: 1 Day Post-Op Procedure(s) (LRB): TOTAL HIP ARTHROPLASTY ANTERIOR APPROACH (Left) Principal Problem:   OA (osteoarthritis) of hip Active Problems:   S/P left THA, AA   Status post total hip replacement, left  Estimated body mass index is 29.89 kg/m as calculated from the following:   Height as of this encounter: 5\' 6"  (1.676 m).   Weight as of this encounter: 84 kg. Advance diet Up with therapy D/C IV fluids when tolerating POs well  DVT Prophylaxis - Aspirin Weight Bearing As Tolerated  Hemovac Pulled   We will have her start with therapy today. She will likely need a couple sessions today. If she is progressing and meeting her goals, DC home today. Follow up in office in 2 weeks. Discharge instructions given.   Ardeen Jourdain, PA-C Orthopaedic Surgery 08/03/2019, 7:34 AM

## 2019-08-03 NOTE — Progress Notes (Signed)
Chart Reviewed Therapy Plan: HEP DME-Has DME

## 2019-08-03 NOTE — Evaluation (Addendum)
Physical Therapy Evaluation Patient Details Name: Morgan [name] MRN: [id number] DOB: 08/12/1941 Today's Date: 08/03/2019   History of Present Illness  Pt s/p L THR and with hx of MVP, DJD, and R THR by posterior approach in 2010  Clinical Impression  Pt s/p L THR and presents with decreased L LE strength/ROM and post op pain limiting functional mobility.  Pt should progress to dec home with assist of family.      Follow Up Recommendations Follow surgeon's recommendation for DC plan and follow-up therapies    Equipment Recommendations  None recommended by PT    Recommendations for Other Services       Precautions / Restrictions Precautions Precautions: Fall Restrictions Weight Bearing Restrictions: No Other Position/Activity Restrictions: WBAT      Mobility  Bed Mobility Overal bed mobility: Needs Assistance Bed Mobility: Supine to Sit     Supine to sit: Min assist     General bed mobility comments: cues for sequence and use of R LE to self assist.  Physical assist to manage L LE  Transfers Overall transfer level: Needs assistance Equipment used: Rolling walker (2 wheeled) Transfers: Sit to/from Stand Sit to Stand: Min assist         General transfer comment: cues for LE management and use of UEs to self assist  Ambulation/Gait Ambulation/Gait assistance: Min assist Gait Distance (Feet): 6 Feet Assistive device: Rolling walker (2 wheeled) Gait Pattern/deviations: Step-to pattern;Decreased step length - right;Decreased step length - left;Shuffle;Trunk flexed Gait velocity: decr   General Gait Details: cues for sequence, posture and position from RW; bed to recliner for bfast  Stairs            Wheelchair Mobility    Modified Rankin (Stroke Patients Only)       Balance Overall balance assessment: Mild deficits observed, not formally tested                                           Pertinent Vitals/Pain Pain Assessment:  0-10 Pain Score: 3  Pain Location: L hip Pain Descriptors / Indicators: Aching;Sore Pain Intervention(s): Limited activity within patient's tolerance;Monitored during session;Premedicated before session    [place] expects to be discharged to:: Private residence Living Arrangements: Alone Available Help at Discharge: Family;Available 24 hours/day Type of Home: House Home Access: Stairs to enter Entrance Stairs-Rails: Right Entrance Stairs-Number of Steps: 4 Home Layout: One level Home Equipment: Walker - 2 wheels;Cane - single point;Bedside commode      Prior Function Level of Independence: Independent               Hand Dominance        Extremity/Trunk Assessment   Upper Extremity Assessment Upper Extremity Assessment: Overall WFL for tasks assessed    Lower Extremity Assessment Lower Extremity Assessment: LLE deficits/detail LLE Deficits / Details: Strength at hip 2/5 with AAROM at hip to 90 flex and 20 abd    Cervical / Trunk Assessment Cervical / Trunk Assessment: Normal  Communication   Communication: No difficulties  Cognition Arousal/Alertness: Awake/alert Behavior During Therapy: WFL for tasks assessed/performed Overall Cognitive Status: Within Functional Limits for tasks assessed                                        General  Comments      Exercises Total Joint Exercises Ankle Circles/Pumps: AROM;Both;20 reps;Supine Quad Sets: AROM;Both;10 reps;Supine Heel Slides: AAROM;Left;20 reps;Supine Hip ABduction/ADduction: AAROM;Left;15 reps;Supine Long Arc Quad: AROM;Left;10 reps;Seated   Assessment/Plan    PT Assessment Patient needs continued PT services  PT Problem List Decreased strength;Decreased range of motion;Decreased activity tolerance;Decreased mobility;Decreased knowledge of use of DME;Pain       PT Treatment Interventions DME instruction;Gait training;Stair training;Functional mobility  training;Therapeutic activities;Therapeutic exercise;Patient/family education    PT Goals (Current goals can be found in the Care Plan section)  Acute Rehab PT Goals Patient Stated Goal: Regain IND PT Goal Formulation: With patient Time For Goal Achievement: 08/10/19 Potential to Achieve Goals: Good    Frequency 7X/week   Barriers to discharge        Co-evaluation               AM-PAC PT "6 Clicks" Mobility  Outcome Measure Help needed turning from your back to your side while in a flat bed without using bedrails?: A Little Help needed moving from lying on your back to sitting on the side of a flat bed without using bedrails?: A Little Help needed moving to and from a bed to a chair (including a wheelchair)?: A Little Help needed standing up from a chair using your arms (e.g., wheelchair or bedside chair)?: A Little Help needed to walk in hospital room?: A Little Help needed climbing 3-5 steps with a railing? : A Lot 6 Click Score: 17    End of Session Equipment Utilized During Treatment: Gait belt Activity Tolerance: Patient tolerated treatment well Patient left: in chair;with call bell/phone within reach;with chair alarm set Nurse Communication: Mobility status PT Visit Diagnosis: Difficulty in walking, not elsewhere classified (R26.2)    Time: [id number] PT Time Calculation (min) (ACUTE ONLY): 27 min   Charges:   PT Evaluation $PT Eval Low Complexity: 1 Low PT Treatments $Therapeutic Exercise: 8-22 mins        Debe Coder PT Acute Rehabilitation Services Pager [phone] Office [phone]   Jyron Turman 08/03/2019, 11:51 AM

## 2019-08-03 NOTE — Progress Notes (Addendum)
Physical Therapy Treatment Patient Details Name: Morgan [name] MRN: [id number] DOB: Dec 21, 1940 Today's Date: 08/03/2019    History of Present Illness Pt s/p L THR and with hx of MVP, DJD, and R THR by posterior approach in 2010    PT Comments    Pt progressing well with mobility and hopeful for dc home this pm.   Follow Up Recommendations  Follow surgeon's recommendation for DC plan and follow-up therapies     Equipment Recommendations  None recommended by PT    Recommendations for Other Services       Precautions / Restrictions Precautions Precautions: Fall Restrictions Weight Bearing Restrictions: No Other Position/Activity Restrictions: WBAT    Mobility  Bed Mobility Overal bed mobility: Needs Assistance Bed Mobility: Sit to Supine     Supine to sit: Min assist Sit to supine: Min assist   General bed mobility comments: cues for sequence and use of R LE to self assist.  Physical assist to manage L LE  Transfers Overall transfer level: Needs assistance Equipment used: Rolling walker (2 wheeled) Transfers: Sit to/from Stand Sit to Stand: Min guard         General transfer comment: cues for LE management and use of UEs to self assist  Ambulation/Gait Ambulation/Gait assistance: Min guard Gait Distance (Feet): 200 Feet Assistive device: Rolling walker (2 wheeled) Gait Pattern/deviations: Step-to pattern;Step-through pattern;Decreased step length - right;Decreased step length - left;Shuffle;Trunk flexed Gait velocity: decr   General Gait Details: cues for posture, position from RW and initial sequence   Stairs             Wheelchair Mobility    Modified Rankin (Stroke Patients Only)       Balance Overall balance assessment: Mild deficits observed, not formally tested                                          Cognition Arousal/Alertness: Awake/alert Behavior During Therapy: WFL for tasks assessed/performed Overall  Cognitive Status: Within Functional Limits for tasks assessed                                        Exercises Total Joint Exercises Ankle Circles/Pumps: AROM;Both;20 reps;Supine Quad Sets: AROM;Both;10 reps;Supine Heel Slides: AAROM;Left;20 reps;Supine Hip ABduction/ADduction: AAROM;Left;15 reps;Supine Long Arc Quad: AROM;Left;10 reps;Seated    General Comments        Pertinent Vitals/Pain Pain Assessment: 0-10 Pain Score: 3  Pain Location: L hip Pain Descriptors / Indicators: Aching;Sore Pain Intervention(s): Limited activity within patient's tolerance;Monitored during session;Premedicated before session;Ice applied    Home Living Family/patient expects to be discharged to:: Private residence Living Arrangements: Alone Available Help at Discharge: Family;Available 24 hours/day Type of Home: House Home Access: Stairs to enter Entrance Stairs-Rails: Right Home Layout: One level Home Equipment: Walker - 2 wheels;Cane - single point;Bedside commode      Prior Function Level of Independence: Independent          PT Goals (current goals can now be found in the care plan section) Acute Rehab PT Goals Patient Stated Goal: Regain IND PT Goal Formulation: With patient Time For Goal Achievement: 08/10/19 Potential to Achieve Goals: Good Progress towards PT goals: Progressing toward goals    Frequency    7X/week      PT Plan Current plan  remains appropriate    Co-evaluation              AM-PAC PT "6 Clicks" Mobility   Outcome Measure  Help needed turning from your back to your side while in a flat bed without using bedrails?: A Little Help needed moving from lying on your back to sitting on the side of a flat bed without using bedrails?: A Little Help needed moving to and from a bed to a chair (including a wheelchair)?: A Little Help needed standing up from a chair using your arms (e.g., wheelchair or bedside chair)?: A Little Help needed  to walk in hospital room?: A Little Help needed climbing 3-5 steps with a railing? : A Lot 6 Click Score: 17    End of Session Equipment Utilized During Treatment: Gait belt Activity Tolerance: Patient tolerated treatment well Patient left: in bed;with call bell/phone within reach;with bed alarm set Nurse Communication: Mobility status PT Visit Diagnosis: Difficulty in walking, not elsewhere classified (R26.2)     Time: [id number] PT Time Calculation (min) (ACUTE ONLY): 19 min  Charges:  $Gait Training: 8-22 mins $Therapeutic Exercise: 8-22 mins                     [place] Pager [phone] Office [phone]    Daxon Kyne 08/03/2019, 11:56 AM

## 2019-08-07 NOTE — Discharge Summary (Signed)
Physician Discharge Summary   Patient ID: Morgan [name] MRN: [id number] DOB/AGE: Oct 26, 1940 78 y.o.  Admit date: 08/02/2019 Discharge date: 08/03/2019  Primary Diagnosis: Primary osteoarthritis left hip   Admission Diagnoses:  Past Medical History:  Diagnosis Date  . Allergy history unknown   . Anxiety   . Complication of anesthesia    during second C-section- felt like top of head was being pulled off.  . DJD (degenerative joint disease)   . History of hematuria   . Hx of colonic polyps   . Hyperlipidemia   . Mitral valve prolapse   . Osteopenia   . Venous insufficiency    Discharge Diagnoses:   Principal Problem:   OA (osteoarthritis) of hip Active Problems:   S/P left THA, AA   Status post total hip replacement, left  Estimated body mass index is 29.89 kg/m as calculated from the following:   Height as of this encounter: 5\' 6"  (1.676 m).   Weight as of this encounter: 84 kg.  Procedure(s) (LRB): TOTAL HIP ARTHROPLASTY ANTERIOR APPROACH (Left)   Consults: None  HPI: Morgan [name], 78 y.o. female, has a history of pain and functional disability in the left hip(s) due to arthritis and patient has failed non-surgical conservative treatments for greater than 12 weeks to include activity modification.  Onset of symptoms was gradual starting several years ago with gradually worsening course since that time.The patient noted no past surgery on the left hip(s).  Patient currently rates pain in the left hip at 5 out of 10 with activity. Patient has worsening of pain with activity and weight bearing, pain that interfers with activities of daily living and crepitus. Patient has evidence of severe bone-on-bone arthritis with large osteophyte formation and subchondral cystic formation by imaging studies. This condition presents safety issues increasing the risk of falls.There is no current active infection.  Laboratory Data: Admission on 08/02/2019, Discharged on 08/03/2019    Component Date Value Ref Range Status  . WBC 08/03/2019 7.2  4.0 - 10.5 K/uL Final  . RBC 08/03/2019 4.09  3.87 - 5.11 MIL/uL Final  . Hemoglobin 08/03/2019 11.7* 12.0 - 15.0 g/dL Final  . HCT 08/03/2019 38.1  36.0 - 46.0 % Final  . MCV 08/03/2019 93.2  80.0 - 100.0 fL Final  . MCH 08/03/2019 28.6  26.0 - 34.0 pg Final  . MCHC 08/03/2019 30.7  30.0 - 36.0 g/dL Final  . RDW 08/03/2019 14.7  11.5 - 15.5 % Final  . Platelets 08/03/2019 238  150 - 400 K/uL Final  . nRBC 08/03/2019 0.0  0.0 - 0.2 % Final   Performed at [facility], [place] [address], [place], [place] [address]  . Sodium 08/03/2019 135  135 - 145 mmol/L Final  . Potassium 08/03/2019 4.7  3.5 - 5.1 mmol/L Final  . Chloride 08/03/2019 103  98 - 111 mmol/L Final  . CO2 08/03/2019 25  22 - 32 mmol/L Final  . Glucose, Bld 08/03/2019 161* 70 - 99 mg/dL Final  . BUN 08/03/2019 16  8 - 23 mg/dL Final  . Creatinine, Ser 08/03/2019 1.03* 0.44 - 1.00 mg/dL Final  . Calcium 08/03/2019 8.8* 8.9 - 10.3 mg/dL Final  . GFR calc non Af Amer 08/03/2019 51* >60 mL/min Final  . GFR calc Af Amer 08/03/2019 59* >60 mL/min Final  . Anion gap 08/03/2019 7  5 - 15 Final   Performed at [facility], [place] [address], [place], [place] [address]  Hospital  Outpatient Visit on 07/31/2019  Component Date Value Ref Range Status  . aPTT 07/31/2019 33  24 - 36 seconds Final   Performed at [facility], [place] [address], [place], [place] [address]  . WBC 07/31/2019 2.5* 4.0 - 10.5 K/uL Final  . RBC 07/31/2019 4.41  3.87 - 5.11 MIL/uL Final  . Hemoglobin 07/31/2019 13.0  12.0 - 15.0 g/dL Final  . HCT 07/31/2019 41.3  36.0 - 46.0 % Final  . MCV 07/31/2019 93.7  80.0 - 100.0 fL Final  . MCH 07/31/2019 29.5  26.0 - 34.0 pg Final  . MCHC 07/31/2019 31.5  30.0 - 36.0 g/dL Final  . RDW 07/31/2019 15.2  11.5 - 15.5 % Final  . Platelets 07/31/2019 257  150 - 400 K/uL Final  . nRBC 07/31/2019 0.0  0.0 - 0.2 %  Final   Performed at [facility], [place] [address] [place], [place] [address]  . Sodium 07/31/2019 141  135 - 145 mmol/L Final  . Potassium 07/31/2019 3.8  3.5 - 5.1 mmol/L Final  . Chloride 07/31/2019 108  98 - 111 mmol/L Final  . CO2 07/31/2019 25  22 - 32 mmol/L Final  . Glucose, Bld 07/31/2019 111* 70 - 99 mg/dL Final  . BUN 07/31/2019 13  8 - 23 mg/dL Final  . Creatinine, Ser 07/31/2019 1.06* 0.44 - 1.00 mg/dL Final  . Calcium 07/31/2019 9.0  8.9 - 10.3 mg/dL Final  . Total Protein 07/31/2019 6.0* 6.5 - 8.1 g/dL Final  . Albumin 07/31/2019 3.4* 3.5 - 5.0 g/dL Final  . AST 07/31/2019 20  15 - 41 U/L Final  . ALT 07/31/2019 17  0 - 44 U/L Final  . Alkaline Phosphatase 07/31/2019 76  38 - 126 U/L Final  . Total Bilirubin 07/31/2019 0.3  0.3 - 1.2 mg/dL Final  . GFR calc non Af Amer 07/31/2019 50* >60 mL/min Final  . GFR calc Af Amer 07/31/2019 57* >60 mL/min Final  . Anion gap 07/31/2019 8  5 - 15 Final   Performed at [facility], [place] [address] [place], [place] [address]  . Prothrombin Time 07/31/2019 12.4  11.4 - 15.2 seconds Final  . INR 07/31/2019 0.9  0.8 - 1.2 Final   Comment: (NOTE) INR goal varies based on device and disease states. Performed at [facility], [place] [address], [place], [place] [address]   . ABO/RH(D) 07/31/2019 O POS   Final  . Antibody Screen 07/31/2019 NEG   Final  . Sample Expiration 07/31/2019 08/05/2019,2359   Final  . Extend sample reason 07/31/2019    Final                   Value:NO TRANSFUSIONS OR PREGNANCY IN THE PAST 3 MONTHS Performed at [facility], [place] [address] [place],  [address]   . MRSA, PCR 07/31/2019 NEGATIVE  NEGATIVE Final  . Staphylococcus aureus 07/31/2019 NEGATIVE  NEGATIVE Final   Comment: (NOTE) The Xpert SA Assay (FDA approved for NASAL specimens in patients 31 years of age and older), is one component of a comprehensive surveillance  program. It is not intended to diagnose infection nor to guide or monitor treatment. Performed at [facility], [place] [address], [place],  [address]   Hospital Outpatient Visit on 07/29/2019  Component Date Value Ref Range Status  . SARS-CoV-2, NAA 07/29/2019 NOT DETECTED  NOT DETECTED Final   Comment: (NOTE) This nucleic acid amplification  test was developed and its performance characteristics determined by [employer]. Nucleic acid amplification tests include PCR and TMA. This test has not been FDA cleared or approved. This test has been authorized by FDA under an Emergency Use Authorization (EUA). This test is only authorized for the duration of time the declaration that circumstances exist justifying the authorization of the emergency use of in vitro diagnostic tests for detection of SARS-CoV-2 virus and/or diagnosis of COVID-19 infection under section 564(b)(1) of the Act, 21 U.S.C. PT:2852782) (1), unless the authorization is terminated or revoked sooner. When diagnostic testing is negative, the possibility of a false negative result should be considered in the context of a patient's recent exposures and the presence of clinical signs and symptoms consistent with COVID-19. An individual without symptoms of COVID- 19 and who is not shedding SARS-CoV-2 vi                          rus would expect to have a negative (not detected) result in this assay. Performed At: [facility] [place], [place] [id number] Rush Farmer MD [id number]   . Coronavirus Source 07/29/2019 NASOPHARYNGEAL   Final   Performed at [facility], [place] [address] [place], [place] [address]     X-Rays:DG Pelvis Portable  Result Date: 08/02/2019 CLINICAL DATA:  Post total hip arthroplasty EXAM: PORTABLE PELVIS 1-2 VIEWS COMPARISON:  Portable exam 1624 hours compared intraoperative images of 08/02/2019 FINDINGS: BILATERAL hip prostheses  identified. Surgical drain projects over LEFT hip. No fracture, dislocation, or bone destruction. IMPRESSION: No acute abnormalities. Electronically Signed   By: Lavonia Dana M.D.   On: 08/02/2019 16:37   DG C-Arm 1-60 Min-No Report  Result Date: 08/02/2019 CLINICAL DATA:  78 year old female with left hip surgery EXAM: OPERATIVE LEFT HIP (WITH PELVIS IF PERFORMED)  VIEWS TECHNIQUE: Fluoroscopic spot image(s) were submitted for interpretation post-operatively. COMPARISON:  None. FINDINGS: Limited intraoperative fluoroscopic spot images. Images demonstrate surgical changes of left hip arthroplasty with gas at the surgical bed. Components are aligned. Surgical changes of right hip arthroplasty. IMPRESSION: Limited intraoperative fluoroscopic spot images demonstrating left hip arthroplasty with no complicating features. Please refer to the dictated operative report for full details of intraoperative findings and procedure. Electronically Signed   By: Corrie Mckusick D.O.   On: 08/02/2019 16:23   DG HIP OPERATIVE UNILAT W OR W/O PELVIS LEFT  Result Date: 08/02/2019 CLINICAL DATA:  78 year old female with left hip surgery EXAM: OPERATIVE LEFT HIP (WITH PELVIS IF PERFORMED)  VIEWS TECHNIQUE: Fluoroscopic spot image(s) were submitted for interpretation post-operatively. COMPARISON:  None. FINDINGS: Limited intraoperative fluoroscopic spot images. Images demonstrate surgical changes of left hip arthroplasty with gas at the surgical bed. Components are aligned. Surgical changes of right hip arthroplasty. IMPRESSION: Limited intraoperative fluoroscopic spot images demonstrating left hip arthroplasty with no complicating features. Please refer to the dictated operative report for full details of intraoperative findings and procedure. Electronically Signed   By: Corrie Mckusick D.O.   On: 08/02/2019 16:23    EKG: Orders placed or performed during the hospital encounter of 10/25/17  . EKG 12 lead  . EKG 12 lead      Hospital Course: Patient was admitted to [facility] and taken to the OR and underwent the above state procedure without complications.  Patient tolerated the procedure well and was later transferred to the recovery room and then to the orthopaedic floor for postoperative care.  They  were given PO and IV analgesics for pain control following their surgery.  They were given 24 hours of postoperative antibiotics of  Anti-infectives (From admission, onward)   Start     Dose/Rate Route Frequency Ordered Stop   08/02/19 2130  ceFAZolin (ANCEF) IVPB 2g/100 mL premix     2 g 200 mL/hr over 30 Minutes Intravenous Every 6 hours 08/02/19 1800 08/03/19 0455   08/02/19 1330  ceFAZolin (ANCEF) IVPB 2g/100 mL premix     2 g 200 mL/hr over 30 Minutes Intravenous On call to O.R. 08/02/19 1322 08/02/19 1501     and started on DVT prophylaxis in the form of Aspirin.   PT and OT were ordered for total hip protocol.  The patient was allowed to be WBAT with therapy. Discharge planning was consulted to help with postop disposition and equipment needs.  Patient had a good night on the evening of surgery.  They started to get up OOB with therapy on day one.  Hemovac drain was pulled without difficulty.  The patient had progressed with therapy and meeting their goals.  Incision was healing well.  Patient was seen in rounds and was ready to go home.  Diet: Cardiac diet Activity:WBAT Follow-up:in 2 weeks Disposition - Home Discharged Condition: stable   Discharge Instructions    Call MD / Call 911   Complete by: As directed    If you experience chest pain or shortness of breath, CALL 911 and be transported to the hospital emergency room.  If you develope a fever above 101 F, pus (white drainage) or increased drainage or redness at the wound, or calf pain, call your surgeon's office.   Constipation Prevention   Complete by: As directed    Drink plenty of fluids.  Prune juice may be helpful.  You may use a  stool softener, such as Colace (over the counter) 100 mg twice a day.  Use MiraLax (over the counter) for constipation as needed.   Diet - low sodium heart healthy   Complete by: As directed    Discharge instructions   Complete by: As directed    Dr. Gaynelle Arabian Total Joint Specialist [facility] [address] [place], [place] [address] [phone]  ANTERIOR APPROACH TOTAL HIP REPLACEMENT POSTOPERATIVE DIRECTIONS   Hip Rehabilitation, Guidelines Following Surgery  The results of a hip operation are greatly improved after range of motion and muscle strengthening exercises. Follow all safety measures which are given to protect your hip. If any of these exercises cause increased pain or swelling in your joint, decrease the amount until you are comfortable again. Then slowly increase the exercises. Call your caregiver if you have problems or questions.   HOME CARE INSTRUCTIONS  Remove items at home which could result in a fall. This includes throw rugs or furniture in walking pathways.  ICE to the affected hip every three hours for 30 minutes at a time and then as needed for pain and swelling.  Continue to use ice on the hip for pain and swelling from surgery. You may notice swelling that will progress down to the foot and ankle.  This is normal after surgery.  Elevate the leg when you are not up walking on it.   Continue to use the breathing machine which will help keep your temperature down.  It is common for your temperature to cycle up and down following surgery, especially at night when you are not up moving around and exerting yourself.  The  breathing machine keeps your lungs expanded and your temperature down.  DIET You may resume your previous home diet once your are discharged from the hospital.  DRESSING / WOUND CARE / SHOWERING You may change your dressing 3-5 days after surgery.  Then change the dressing every day with sterile gauze.  Please use good hand washing  techniques before changing the dressing.  Do not use any lotions or creams on the incision until instructed by your surgeon. You may start showering once you are discharged home but do not submerge the incision under water. Just pat the incision dry and apply a dry gauze dressing on daily. Change the surgical dressing daily and reapply a dry dressing each time.  ACTIVITY Walk with your walker as instructed. Use walker as long as suggested by your caregivers. Avoid periods of inactivity such as sitting longer than an hour when not asleep. This helps prevent blood clots.  You may resume a sexual relationship in one month or when given the OK by your doctor.  You may return to work once you are cleared by your doctor.  Do not drive a car for 6 weeks or until released by you surgeon.  Do not drive while taking narcotics.  WEIGHT BEARING Weight bearing as tolerated with assist device (walker, cane, etc) as directed, use it as long as suggested by your surgeon or therapist, typically at least 4-6 weeks.  POSTOPERATIVE CONSTIPATION PROTOCOL Constipation - defined medically as fewer than three stools per week and severe constipation as less than one stool per week.  One of the most common issues patients have following surgery is constipation.  Even if you have a regular bowel pattern at home, your normal regimen is likely to be disrupted due to multiple reasons following surgery.  Combination of anesthesia, postoperative narcotics, change in appetite and fluid intake all can affect your bowels.  In order to avoid complications following surgery, here are some recommendations in order to help you during your recovery period.  Colace (docusate) - Pick up an over-the-counter form of Colace or another stool softener and take twice a day as long as you are requiring postoperative pain medications.  Take with a full glass of water daily.  If you experience loose stools or diarrhea, hold the colace until you  stool forms back up.  If your symptoms do not get better within 1 week or if they get worse, check with your doctor.  Dulcolax (bisacodyl) - Pick up over-the-counter and take as directed by the product packaging as needed to assist with the movement of your bowels.  Take with a full glass of water.  Use this product as needed if not relieved by Colace only.   MiraLax (polyethylene glycol) - Pick up over-the-counter to have on hand.  MiraLax is a solution that will increase the amount of water in your bowels to assist with bowel movements.  Take as directed and can mix with a glass of water, juice, soda, coffee, or tea.  Take if you go more than two days without a movement. Do not use MiraLax more than once per day. Call your doctor if you are still constipated or irregular after using this medication for 7 days in a row.  If you continue to have problems with postoperative constipation, please contact the office for further assistance and recommendations.  If you experience "the worst abdominal pain ever" or develop nausea or vomiting, please contact the office immediatly for further recommendations for treatment.  ITCHING  If you experience itching with your medications, try taking only a single pain pill, or even half a pain pill at a time.  You can also use Benadryl over the counter for itching or also to help with sleep.   TED HOSE STOCKINGS Wear the elastic stockings on both legs for three weeks following surgery during the day but you may remove then at night for sleeping.  MEDICATIONS See your medication summary on the "After Visit Summary" that the nursing staff will review with you prior to discharge.  You may have some home medications which will be placed on hold until you complete the course of blood thinner medication.  It is important for you to complete the blood thinner medication as prescribed by your surgeon.  Continue your approved medications as instructed at time of  discharge.  PRECAUTIONS If you experience chest pain or shortness of breath - call 911 immediately for transfer to the hospital emergency department.  If you develop a fever greater that 101 F, purulent drainage from wound, increased redness or drainage from wound, foul odor from the wound/dressing, or calf pain - CONTACT YOUR SURGEON.                                                   FOLLOW-UP APPOINTMENTS Make sure you keep all of your appointments after your operation with your surgeon and caregivers. You should call the office at the above phone number and make an appointment for approximately two weeks after the date of your surgery or on the date instructed by your surgeon outlined in the "After Visit Summary".  RANGE OF MOTION AND STRENGTHENING EXERCISES  These exercises are designed to help you keep full movement of your hip joint. Follow your caregiver's or physical therapist's instructions. Perform all exercises about fifteen times, three times per day or as directed. Exercise both hips, even if you have had only one joint replacement. These exercises can be done on a training (exercise) mat, on the floor, on a table or on a bed. Use whatever works the best and is most comfortable for you. Use music or television while you are exercising so that the exercises are a pleasant break in your day. This will make your life better with the exercises acting as a break in routine you can look forward to.  Lying on your back, slowly slide your foot toward your buttocks, raising your knee up off the floor. Then slowly slide your foot back down until your leg is straight again.  Lying on your back spread your legs as far apart as you can without causing discomfort.  Lying on your side, raise your upper leg and foot straight up from the floor as far as is comfortable. Slowly lower the leg and repeat.  Lying on your back, tighten up the muscle in the front of your thigh (quadriceps muscles). You can do  this by keeping your leg straight and trying to raise your heel off the floor. This helps strengthen the largest muscle supporting your knee.  Lying on your back, tighten up the muscles of your buttocks both with the legs straight and with the knee bent at a comfortable angle while keeping your heel on the floor.   IF YOU ARE TRANSFERRED TO A SKILLED REHAB FACILITY If the patient is transferred to a skilled rehab  facility following release from the hospital, a list of the current medications will be sent to the facility for the patient to continue.  When discharged from the skilled rehab facility, please have the facility set up the patient's [place] prior to being released. Also, the skilled facility will be responsible for providing the patient with their medications at time of release from the facility to include their pain medication, the muscle relaxants, and their blood thinner medication. If the patient is still at the rehab facility at time of the two week follow up appointment, the skilled rehab facility will also need to assist the patient in arranging follow up appointment in our office and any transportation needs.  MAKE SURE YOU:  Understand these instructions.  Get help right away if you are not doing well or get worse.    Pick up stool softner and laxative for home use following surgery while on pain medications. Do not submerge incision under water. Please use good hand washing techniques while changing dressing each day. May shower starting three days after surgery. Please use a clean towel to pat the incision dry following showers. Continue to use ice for pain and swelling after surgery. Do not use any lotions or creams on the incision until instructed by your surgeon.   Increase activity slowly as tolerated   Complete by: As directed      Allergies as of 08/03/2019      Reactions   Atorvastatin Other (See Comments)   REACTION: pt states INTOL to  Lipitor w/ constipation   Penicillins Hives, Other (See Comments)   Has patient had a PCN reaction causing immediate rash, facial/tongue/throat swelling, SOB or lightheadedness with hypotension: No Has patient had a PCN reaction causing severe rash involving mucus membranes or skin necrosis: No Has patient had a PCN reaction that required hospitalization: No Has patient had a PCN reaction occurring within the last 10 years: No If all of the above answers are "NO", then may proceed with Cephalosporin use.      Medication List    TAKE these medications   aspirin 325 MG EC tablet Take 1 tablet (325 mg total) by mouth 2 (two) times daily. Take 1 tablets by mouth 2 times daily for 3 weeks.   Caltrate 600+D 600-400 MG-UNIT tablet Generic drug: Calcium Carbonate-Vitamin D Take 1 tablet by mouth daily.   ferrous sulfate 325 (65 FE) MG tablet Take 325 mg by mouth daily with breakfast.   HYDROcodone-acetaminophen 5-325 MG tablet Commonly known as: NORCO/VICODIN Take 1-2 tablets by mouth every 6 (six) hours as needed for severe pain.   methocarbamol 500 MG tablet Commonly known as: ROBAXIN Take 1 tablet (500 mg total) by mouth every 6 (six) hours as needed for muscle spasms.   sodium chloride 5 % ophthalmic solution Commonly known as: MURO 128 Place 1 drop into both eyes as needed for eye irritation.   traMADol 50 MG tablet Commonly known as: ULTRAM Take 1-2 tablets (50-100 mg total) by mouth every 6 (six) hours as needed for moderate pain.   Vitamin D 50 MCG (2000 UT) Caps Take 2,000 Units by mouth daily.      Follow-up Information    Gaynelle Arabian, MD. Schedule an appointment as soon as possible for a visit in 2 weeks.   Specialty: Orthopedic Surgery Contact information: [address] [place] [address] [id number]           Signed: Ardeen Jourdain, PA-C Orthopaedic Surgery  08/07/2019, 7:47 AM

## 2019-09-05 DIAGNOSIS — Z471 Aftercare following joint replacement surgery: Secondary | ICD-10-CM | POA: Diagnosis not present

## 2019-09-05 DIAGNOSIS — Z96642 Presence of left artificial hip joint: Secondary | ICD-10-CM | POA: Diagnosis not present

## 2019-10-16 DIAGNOSIS — F419 Anxiety disorder, unspecified: Secondary | ICD-10-CM | POA: Diagnosis not present

## 2019-10-16 DIAGNOSIS — D649 Anemia, unspecified: Secondary | ICD-10-CM | POA: Diagnosis not present

## 2019-10-16 DIAGNOSIS — E785 Hyperlipidemia, unspecified: Secondary | ICD-10-CM | POA: Diagnosis not present

## 2019-10-16 DIAGNOSIS — R7989 Other specified abnormal findings of blood chemistry: Secondary | ICD-10-CM | POA: Diagnosis not present

## 2019-10-16 DIAGNOSIS — M810 Age-related osteoporosis without current pathological fracture: Secondary | ICD-10-CM | POA: Diagnosis not present

## 2019-10-16 DIAGNOSIS — D6489 Other specified anemias: Secondary | ICD-10-CM | POA: Diagnosis not present

## 2020-01-23 DIAGNOSIS — M1712 Unilateral primary osteoarthritis, left knee: Secondary | ICD-10-CM | POA: Diagnosis not present

## 2020-04-26 DIAGNOSIS — E785 Hyperlipidemia, unspecified: Secondary | ICD-10-CM | POA: Diagnosis not present

## 2020-04-26 DIAGNOSIS — E559 Vitamin D deficiency, unspecified: Secondary | ICD-10-CM | POA: Diagnosis not present

## 2020-04-30 DIAGNOSIS — R82998 Other abnormal findings in urine: Secondary | ICD-10-CM | POA: Diagnosis not present

## 2020-04-30 DIAGNOSIS — Z0000 Encounter for general adult medical examination without abnormal findings: Secondary | ICD-10-CM | POA: Diagnosis not present

## 2020-04-30 DIAGNOSIS — N1832 Chronic kidney disease, stage 3b: Secondary | ICD-10-CM | POA: Diagnosis not present

## 2020-04-30 DIAGNOSIS — E559 Vitamin D deficiency, unspecified: Secondary | ICD-10-CM | POA: Diagnosis not present

## 2020-04-30 DIAGNOSIS — D649 Anemia, unspecified: Secondary | ICD-10-CM | POA: Diagnosis not present

## 2020-05-13 DIAGNOSIS — M25511 Pain in right shoulder: Secondary | ICD-10-CM | POA: Diagnosis not present

## 2020-05-13 DIAGNOSIS — M25512 Pain in left shoulder: Secondary | ICD-10-CM | POA: Diagnosis not present

## 2020-05-22 DIAGNOSIS — L814 Other melanin hyperpigmentation: Secondary | ICD-10-CM | POA: Diagnosis not present

## 2020-05-22 DIAGNOSIS — L821 Other seborrheic keratosis: Secondary | ICD-10-CM | POA: Diagnosis not present

## 2020-05-27 DIAGNOSIS — M25511 Pain in right shoulder: Secondary | ICD-10-CM | POA: Diagnosis not present

## 2020-05-27 DIAGNOSIS — M25512 Pain in left shoulder: Secondary | ICD-10-CM | POA: Diagnosis not present

## 2020-05-27 DIAGNOSIS — M19012 Primary osteoarthritis, left shoulder: Secondary | ICD-10-CM | POA: Diagnosis not present

## 2020-05-27 DIAGNOSIS — M19011 Primary osteoarthritis, right shoulder: Secondary | ICD-10-CM | POA: Diagnosis not present

## 2020-05-29 DIAGNOSIS — M19011 Primary osteoarthritis, right shoulder: Secondary | ICD-10-CM | POA: Diagnosis not present

## 2020-05-29 DIAGNOSIS — M25511 Pain in right shoulder: Secondary | ICD-10-CM | POA: Diagnosis not present

## 2020-05-29 DIAGNOSIS — M25512 Pain in left shoulder: Secondary | ICD-10-CM | POA: Diagnosis not present

## 2020-05-29 DIAGNOSIS — M19012 Primary osteoarthritis, left shoulder: Secondary | ICD-10-CM | POA: Diagnosis not present

## 2020-06-03 DIAGNOSIS — M25511 Pain in right shoulder: Secondary | ICD-10-CM | POA: Diagnosis not present

## 2020-06-03 DIAGNOSIS — M19011 Primary osteoarthritis, right shoulder: Secondary | ICD-10-CM | POA: Diagnosis not present

## 2020-06-03 DIAGNOSIS — M25512 Pain in left shoulder: Secondary | ICD-10-CM | POA: Diagnosis not present

## 2020-06-03 DIAGNOSIS — M19012 Primary osteoarthritis, left shoulder: Secondary | ICD-10-CM | POA: Diagnosis not present

## 2020-06-05 DIAGNOSIS — M25512 Pain in left shoulder: Secondary | ICD-10-CM | POA: Diagnosis not present

## 2020-06-05 DIAGNOSIS — M19011 Primary osteoarthritis, right shoulder: Secondary | ICD-10-CM | POA: Diagnosis not present

## 2020-06-05 DIAGNOSIS — M25511 Pain in right shoulder: Secondary | ICD-10-CM | POA: Diagnosis not present

## 2020-06-05 DIAGNOSIS — M19012 Primary osteoarthritis, left shoulder: Secondary | ICD-10-CM | POA: Diagnosis not present

## 2020-06-19 DIAGNOSIS — H52223 Regular astigmatism, bilateral: Secondary | ICD-10-CM | POA: Diagnosis not present

## 2020-07-02 DIAGNOSIS — B372 Candidiasis of skin and nail: Secondary | ICD-10-CM | POA: Diagnosis not present

## 2021-05-05 DIAGNOSIS — M810 Age-related osteoporosis without current pathological fracture: Secondary | ICD-10-CM | POA: Diagnosis not present

## 2021-05-05 DIAGNOSIS — E785 Hyperlipidemia, unspecified: Secondary | ICD-10-CM | POA: Diagnosis not present

## 2021-05-05 DIAGNOSIS — E559 Vitamin D deficiency, unspecified: Secondary | ICD-10-CM | POA: Diagnosis not present

## 2021-06-23 DIAGNOSIS — H52201 Unspecified astigmatism, right eye: Secondary | ICD-10-CM | POA: Diagnosis not present

## 2021-07-24 IMAGING — RF DG C-ARM 1-60 MIN-NO REPORT
1 series · 2 of 2 positions shown · non-contrast
Comparison: None.

CLINICAL DATA: 78-year-old female with left hip surgery

EXAM:
OPERATIVE LEFT HIP (WITH PELVIS IF PERFORMED)  VIEWS
TECHNIQUE: Fluoroscopic spot image(s) were submitted for interpretation
post-operatively.

[Series 1: unknown protocol · 0.20mm/px · 2 of 2 slices shown]
[im 1/2]
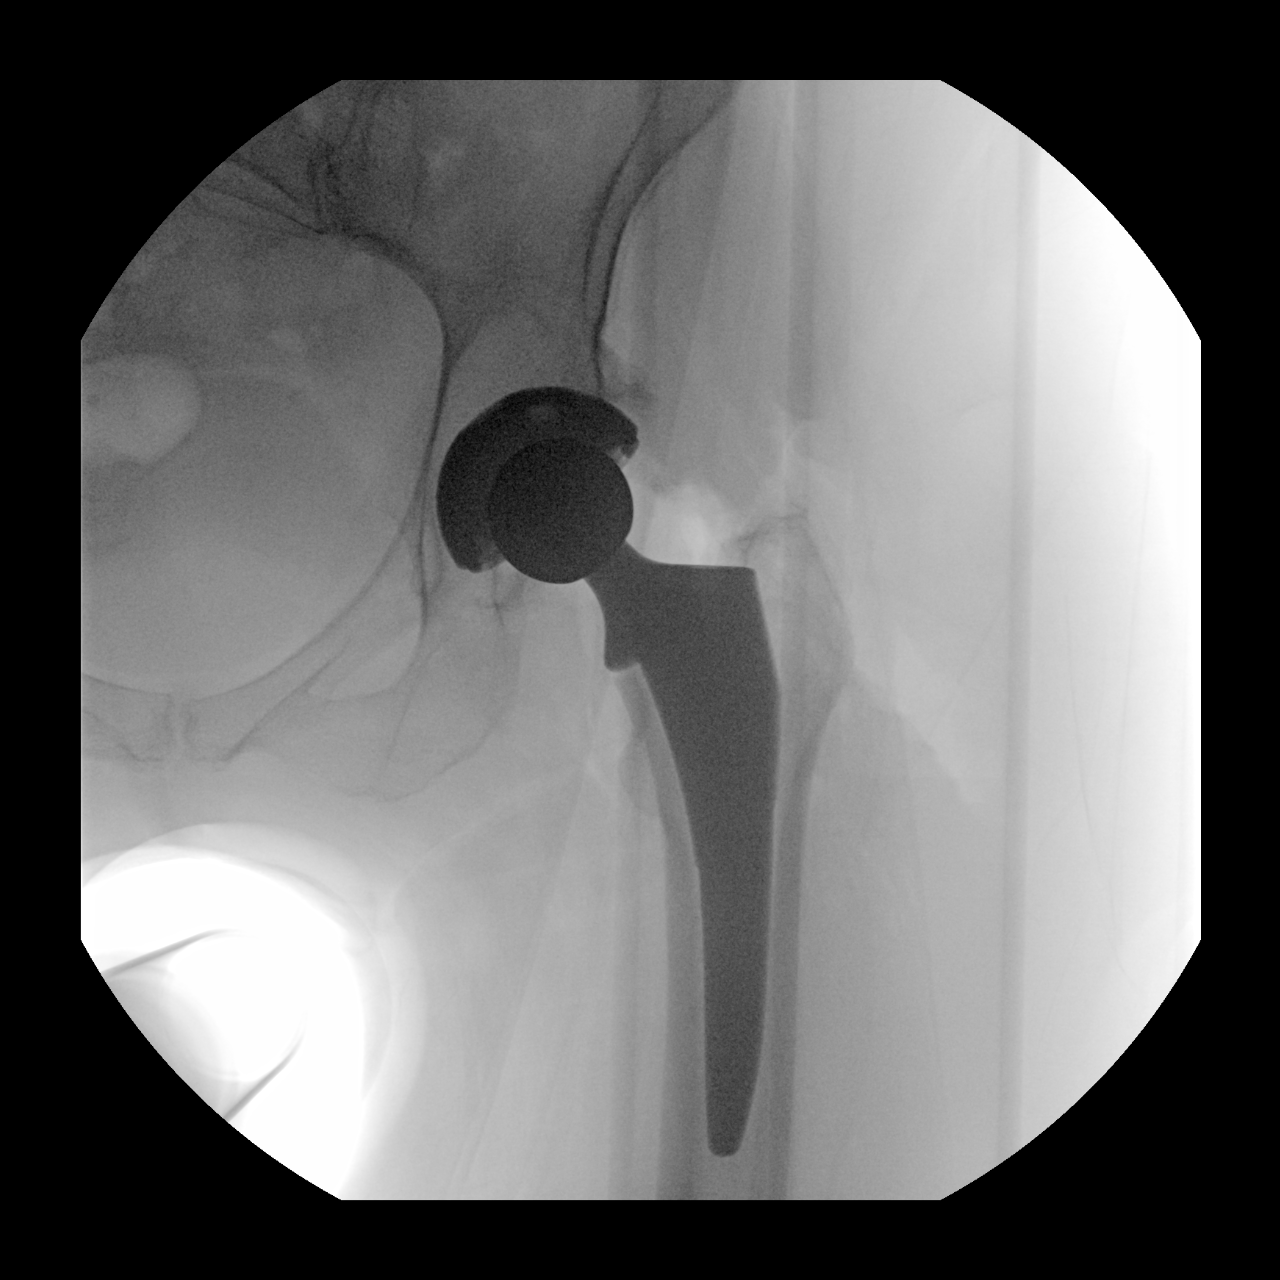
[im 2/2]
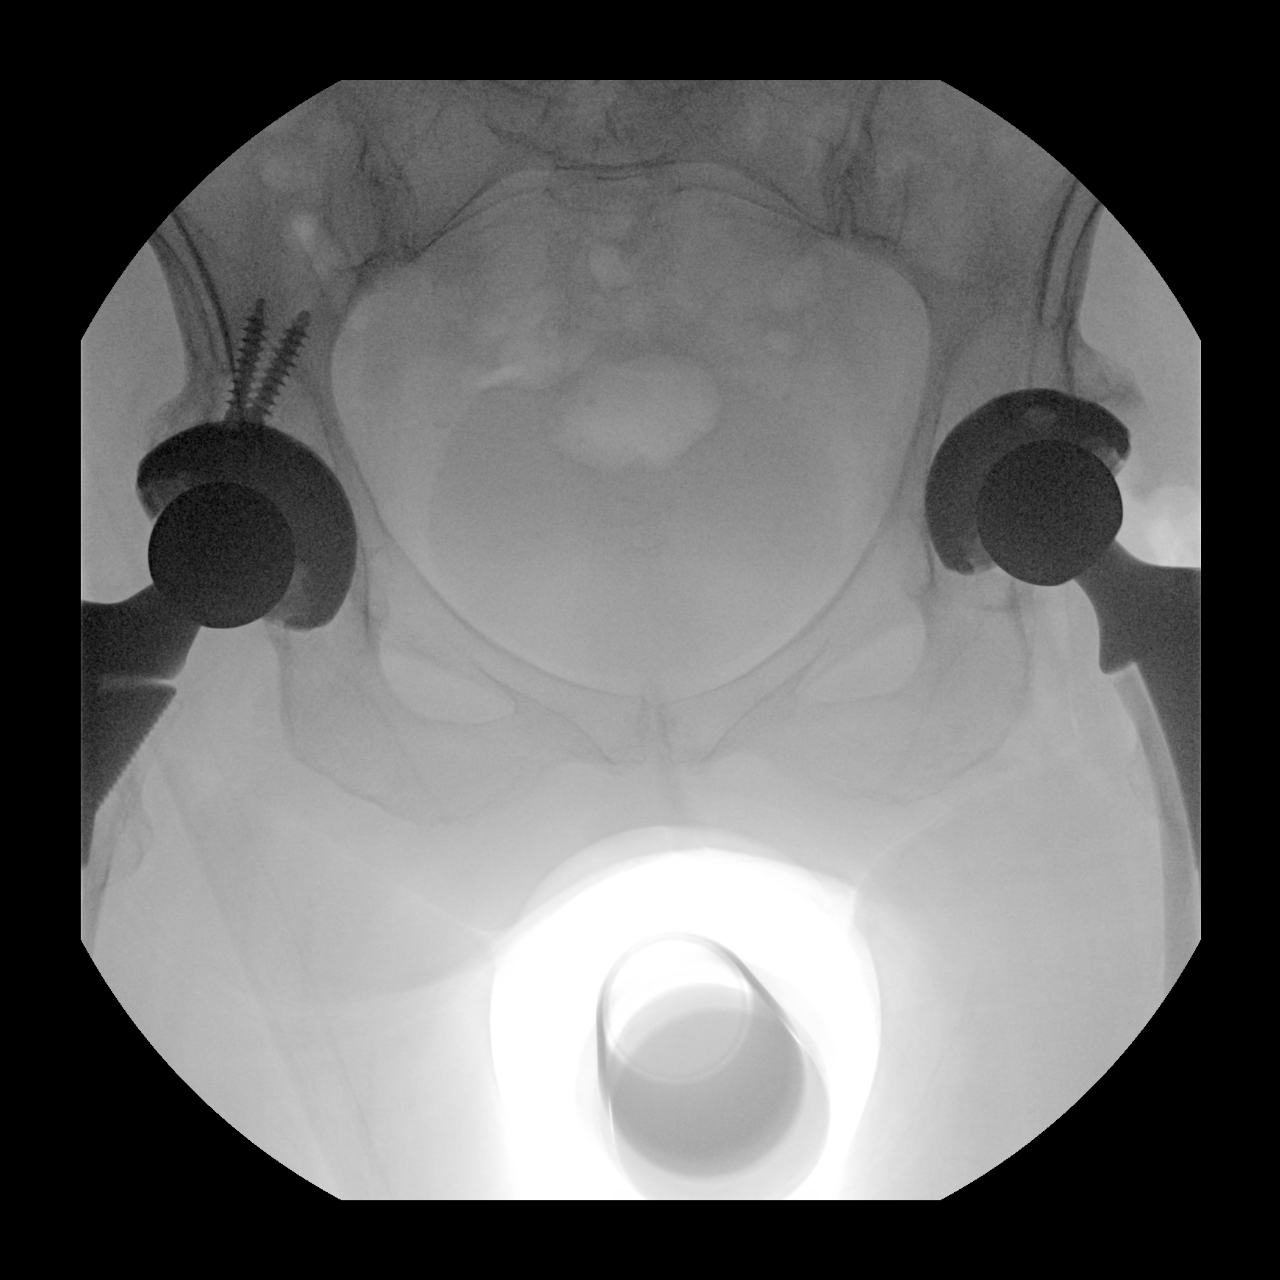

[2 of 2 positions shown; findings below may reference images not displayed]

FINDINGS: Limited intraoperative fluoroscopic spot images.

Images demonstrate surgical changes of left hip arthroplasty with
gas at the surgical bed. Components are aligned. Surgical changes of
right hip arthroplasty.
IMPRESSION: Limited intraoperative fluoroscopic spot images demonstrating left
hip arthroplasty with no complicating features. Please refer to the
dictated operative report for full details of intraoperative
findings and procedure.

## 2022-03-27 DIAGNOSIS — L820 Inflamed seborrheic keratosis: Secondary | ICD-10-CM | POA: Diagnosis not present

## 2022-05-07 DIAGNOSIS — L309 Dermatitis, unspecified: Secondary | ICD-10-CM | POA: Diagnosis not present

## 2022-05-07 DIAGNOSIS — L821 Other seborrheic keratosis: Secondary | ICD-10-CM | POA: Diagnosis not present

## 2022-05-20 DIAGNOSIS — F419 Anxiety disorder, unspecified: Secondary | ICD-10-CM | POA: Diagnosis not present

## 2022-05-20 DIAGNOSIS — N1832 Chronic kidney disease, stage 3b: Secondary | ICD-10-CM | POA: Diagnosis not present

## 2022-05-20 DIAGNOSIS — E785 Hyperlipidemia, unspecified: Secondary | ICD-10-CM | POA: Diagnosis not present

## 2022-05-20 DIAGNOSIS — E559 Vitamin D deficiency, unspecified: Secondary | ICD-10-CM | POA: Diagnosis not present

## 2022-05-27 DIAGNOSIS — R82998 Other abnormal findings in urine: Secondary | ICD-10-CM | POA: Diagnosis not present

## 2022-05-27 DIAGNOSIS — N1832 Chronic kidney disease, stage 3b: Secondary | ICD-10-CM | POA: Diagnosis not present

## 2022-05-27 DIAGNOSIS — D649 Anemia, unspecified: Secondary | ICD-10-CM | POA: Diagnosis not present

## 2022-05-27 DIAGNOSIS — Z23 Encounter for immunization: Secondary | ICD-10-CM | POA: Diagnosis not present

## 2022-05-27 DIAGNOSIS — E559 Vitamin D deficiency, unspecified: Secondary | ICD-10-CM | POA: Diagnosis not present

## 2022-05-27 DIAGNOSIS — Z0000 Encounter for general adult medical examination without abnormal findings: Secondary | ICD-10-CM | POA: Diagnosis not present

## 2022-06-15 DIAGNOSIS — H52203 Unspecified astigmatism, bilateral: Secondary | ICD-10-CM | POA: Diagnosis not present

## 2023-05-27 DIAGNOSIS — R946 Abnormal results of thyroid function studies: Secondary | ICD-10-CM | POA: Diagnosis not present

## 2023-05-27 DIAGNOSIS — E559 Vitamin D deficiency, unspecified: Secondary | ICD-10-CM | POA: Diagnosis not present

## 2023-05-27 DIAGNOSIS — E785 Hyperlipidemia, unspecified: Secondary | ICD-10-CM | POA: Diagnosis not present

## 2023-05-27 DIAGNOSIS — D72819 Decreased white blood cell count, unspecified: Secondary | ICD-10-CM | POA: Diagnosis not present

## 2023-05-27 DIAGNOSIS — D649 Anemia, unspecified: Secondary | ICD-10-CM | POA: Diagnosis not present

## 2023-05-31 ENCOUNTER — Emergency Department (HOSPITAL_COMMUNITY)
Admission: EM | Admit: 2023-05-31 | Discharge: 2023-05-31 | Disposition: A | Discharge status: Home / Self Care | Payer: Medicare Other | Attending: Emergency Medicine | Admitting: Emergency Medicine

## 2023-05-31 ENCOUNTER — Emergency Department (HOSPITAL_COMMUNITY): Payer: Medicare Other

## 2023-05-31 ENCOUNTER — Other Ambulatory Visit: Payer: Self-pay

## 2023-05-31 ENCOUNTER — Encounter (HOSPITAL_COMMUNITY): Payer: Self-pay

## 2023-05-31 DIAGNOSIS — Z7982 Long term (current) use of aspirin: Secondary | ICD-10-CM | POA: Diagnosis not present

## 2023-05-31 DIAGNOSIS — W19XXXA Unspecified fall, initial encounter: Secondary | ICD-10-CM | POA: Diagnosis not present

## 2023-05-31 DIAGNOSIS — R55 Syncope and collapse: Secondary | ICD-10-CM | POA: Insufficient documentation

## 2023-05-31 DIAGNOSIS — R918 Other nonspecific abnormal finding of lung field: Secondary | ICD-10-CM | POA: Diagnosis not present

## 2023-05-31 DIAGNOSIS — M79602 Pain in left arm: Secondary | ICD-10-CM | POA: Diagnosis not present

## 2023-05-31 LAB — CBC WITH DIFFERENTIAL/PLATELET
Abs Immature Granulocytes: 0.02 10*3/uL (ref 0.00–0.07)
Basophils Absolute: 0 10*3/uL (ref 0.0–0.1)
Basophils Relative: 0 %
Eosinophils Absolute: 0 10*3/uL (ref 0.0–0.5)
Eosinophils Relative: 0 %
HCT: 38.2 % (ref 36.0–46.0)
Hemoglobin: 11.4 g/dL — ABNORMAL LOW (ref 12.0–15.0)
Immature Granulocytes: 0 %
Lymphocytes Relative: 5 %
Lymphs Abs: 0.4 10*3/uL — ABNORMAL LOW (ref 0.7–4.0)
MCH: 26 pg (ref 26.0–34.0)
MCHC: 29.8 g/dL — ABNORMAL LOW (ref 30.0–36.0)
MCV: 87 fL (ref 80.0–100.0)
Monocytes Absolute: 0.3 10*3/uL (ref 0.1–1.0)
Monocytes Relative: 4 %
Neutro Abs: 7.1 10*3/uL (ref 1.7–7.7)
Neutrophils Relative %: 91 %
Platelets: 271 10*3/uL (ref 150–400)
RBC: 4.39 MIL/uL (ref 3.87–5.11)
RDW: 14.6 % (ref 11.5–15.5)
WBC: 7.9 10*3/uL (ref 4.0–10.5)
nRBC: 0 % (ref 0.0–0.2)

## 2023-05-31 LAB — COMPREHENSIVE METABOLIC PANEL
ALT: 25 U/L (ref 0–44)
AST: 26 U/L (ref 15–41)
Albumin: 3.7 g/dL (ref 3.5–5.0)
Alkaline Phosphatase: 84 U/L (ref 38–126)
Anion gap: 8 (ref 5–15)
BUN: 22 mg/dL (ref 8–23)
CO2: 24 mmol/L (ref 22–32)
Calcium: 9.1 mg/dL (ref 8.9–10.3)
Chloride: 104 mmol/L (ref 98–111)
Creatinine, Ser: 1.21 mg/dL — ABNORMAL HIGH (ref 0.44–1.00)
GFR, Estimated: 44 mL/min — ABNORMAL LOW (ref >60)
Glucose, Bld: 147 mg/dL — ABNORMAL HIGH (ref 70–99)
Potassium: 3.9 mmol/L (ref 3.5–5.1)
Sodium: 136 mmol/L (ref 135–145)
Total Bilirubin: 0.4 mg/dL (ref 0.3–1.2)
Total Protein: 6.6 g/dL (ref 6.5–8.1)

## 2023-05-31 LAB — TROPONIN I (HIGH SENSITIVITY)
Troponin I (High Sensitivity): 5 ng/L (ref <18)
Troponin I (High Sensitivity): 6 ng/L (ref <18)

## 2023-05-31 MED ORDER — ACETAMINOPHEN 325 MG PO TABS
650.0000 mg | ORAL_TABLET | Freq: Once | ORAL | Status: AC
  Administered 2023-05-31: 650 mg via ORAL
  Filled 2023-05-31: qty 2

## 2023-05-31 MED ORDER — POTASSIUM CHLORIDE 10 MEQ/100ML IV SOLN: 10.0000 meq | INTRAVENOUS | Status: DC

## 2023-05-31 NOTE — ED Provider Notes (Signed)
[place] EMERGENCY DEPARTMENT AT [facility] Provider Note   CSN: [id number] Arrival date & time: 05/31/23  1643     History  Chief Complaint  Patient presents with   Near Syncope    Morgan [name] is a 82 y.o. female with no significant past medical history presents today for near syncopal episode while at [facility] being did for a shoulder injury that occurred this morning.  Patient states they were moving her arm for x-rays, which she states was painful, and shortly after she began to see stars.  She never completely passed out but got very pale according to her son.  Her blood pressure also dropped to approximately 90/60.  This episode lasted approximately 45 minutes.  EMS states she did have a period of hypotension and looked a little pale but her blood pressure came back up and color came back to normal without intervention before presenting to the ER.  Patient states she has had similar episodes in the past when seeing blood but she does not like.  Patient has no complaints at this time aside from left shoulder pain for which she has not taken any medication for.   Near Syncope       Home Medications Prior to Admission medications   Medication Sig Start Date End Date Taking? Authorizing Provider  aspirin EC 325 MG EC tablet Take 1 tablet (325 mg total) by mouth 2 (two) times daily. Take 1 tablets by mouth 2 times daily for 3 weeks. 08/03/19   Porterfield, Amber, PA-C  Calcium Carbonate-Vitamin D (CALTRATE 600+D) 600-400 MG-UNIT per tablet Take 1 tablet by mouth daily.    [provider]  Cholecalciferol (VITAMIN D) 2000 units CAPS Take 2,000 Units by mouth daily.     [provider]  ferrous sulfate 325 (65 FE) MG tablet Take 325 mg by mouth daily with breakfast.    [provider]  HYDROcodone-acetaminophen (NORCO/VICODIN) 5-325 MG tablet Take 1-2 tablets by mouth every 6 (six) hours as needed for severe pain. 08/03/19   Porterfield,  Amber, PA-C  methocarbamol (ROBAXIN) 500 MG tablet Take 1 tablet (500 mg total) by mouth every 6 (six) hours as needed for muscle spasms. 08/03/19   Porterfield, Amber, PA-C  sodium chloride (MURO 128) 5 % ophthalmic solution Place 1 drop into both eyes as needed for eye irritation.    [provider]  traMADol (ULTRAM) 50 MG tablet Take 1-2 tablets (50-100 mg total) by mouth every 6 (six) hours as needed for moderate pain. 08/03/19   Porterfield, Hospital doctor, PA-C      Allergies    Atorvastatin and Penicillins    Review of Systems   Review of Systems  Cardiovascular:  Positive for near-syncope.  Neurological:  Positive for syncope.    Physical Exam Updated Vital Signs BP (!) 140/65   Pulse 70   Temp 98.4 F (36.9 C) (Oral)   Resp 20   Ht 5\' 6"  (1.676 m)   Wt 84 kg   SpO2 97%   BMI 29.89 kg/m  Physical Exam Vitals and nursing note reviewed.  Constitutional:      General: She is not in acute distress.    Appearance: She is well-developed.  HENT:     Head: Normocephalic and atraumatic.     Right Ear: External ear normal.     Left Ear: External ear normal.     Nose: Nose normal.     Mouth/Throat:     Mouth: Mucous membranes are  moist.  Eyes:     Conjunctiva/sclera: Conjunctivae normal.  Cardiovascular:     Rate and Rhythm: Normal rate and regular rhythm.     Pulses: Normal pulses.     Heart sounds: Normal heart sounds. No murmur heard. Pulmonary:     Effort: Pulmonary effort is normal. No respiratory distress.     Breath sounds: Normal breath sounds.  Abdominal:     Palpations: Abdomen is soft.     Tenderness: There is no abdominal tenderness.  Musculoskeletal:        General: No swelling.     Cervical back: Neck supple.  Skin:    General: Skin is warm and dry.     Capillary Refill: Capillary refill takes less than 2 seconds.     Coloration: Skin is not pale.  Neurological:     General: No focal deficit present.     Mental Status: She is alert.     Motor:  No weakness.  Psychiatric:        Mood and Affect: Mood normal.     ED Results / Procedures / Treatments   Labs (all labs ordered are listed, but only abnormal results are displayed) Labs Reviewed  COMPREHENSIVE METABOLIC PANEL - Abnormal; Notable for the following components:      Result Value   Glucose, Bld 147 (*)    Creatinine, Ser 1.21 (*)    GFR, Estimated 44 (*)    All other components within normal limits  CBC WITH DIFFERENTIAL/PLATELET - Abnormal; Notable for the following components:   Hemoglobin 11.4 (*)    MCHC 29.8 (*)    Lymphs Abs 0.4 (*)    All other components within normal limits  TROPONIN I (HIGH SENSITIVITY)  TROPONIN I (HIGH SENSITIVITY)    EKG EKG Interpretation Date/Time:  Monday May 31 2023 19:24:03 EDT Ventricular Rate:  77 PR Interval:  186 QRS Duration:  88 QT Interval:  383 QTC Calculation: 434 R Axis:   11  Text Interpretation: Sinus rhythm Ventricular premature complex Borderline T abnormalities, anterior leads Confirmed by Glyn Ade [phone]) on 05/31/2023 7:35:22 PM  Radiology DG Chest 2 View  Result Date: 05/31/2023 CLINICAL DATA:  Syncope EXAM: CHEST - 2 VIEW COMPARISON:  11/23/2011 FINDINGS: Hypoventilatory changes with mild airspace disease left base. Possible trace left effusion. Normal cardiomediastinal silhouette with aortic atherosclerosis. No pneumothorax IMPRESSION: Hypoventilatory changes with mild left basilar atelectasis or minimal infiltrate. Possible trace left effusion. Electronically Signed   By: Jasmine Pang M.D.   On: 05/31/2023 20:30    Procedures Procedures    Medications Ordered in ED Medications  acetaminophen (TYLENOL) tablet 650 mg (650 mg Oral Given 05/31/23 1818)    ED Course/ Medical Decision Making/ A&P Clinical Course as of 05/31/23 2035  Mon May 31, 2023  1724 Stable  54 YOF with near syncope. Was hypotensive. Now back to baseline.  [CC]    Clinical Course User Index [CC] Glyn Ade, MD                                 Medical Decision Making Amount and/or Complexity of Data Reviewed Labs: ordered.  Risk OTC drugs.   This patient presents to the ED with chief complaint(s) of near syncopal episode with pertinent past medical history of none which further complicates the presenting complaint. The complaint involves an extensive differential diagnosis and also carries with it a high risk of complications and  morbidity.    The differential diagnosis includes vasovagal event, MI, aortic stenosis  Additional history obtained: Additional history obtained from spouse Records reviewed Care Everywhere/External Records  ED Course and Reassessment: Patient given Tylenol for pain.  The patients abnormal results were discussed with the patient and any questions asked were answered to the patients satisfaction. Any follow-up care needed was explained to the patient and the patient expressed understanding.  Independent labs interpretation:  The following labs were independently interpreted:  CBC: Mild anemia CMP: Mildly elevated creatinine at 1.21 Troponin: 5, 6 EKG: PVCs, sinus rhythm Orthostatic vital signs: Orthostatic Lying BP- Lying: 139/68 Pulse- Lying: 72 Orthostatic Sitting BP- Sitting: 140/68 Pulse- Sitting: 74 Orthostatic Standing at 0 minutes BP- Standing at 0 minutes: 105/68 Pulse- Standing at 0 minutes: 91 Orthostatic Standing at 3 minutes BP- Standing at 3 minutes: 128/78 Pulse- Standing at 3 minutes: 91   Independent visualization of imaging: - I independently visualized the following imaging with scope of interpretation limited to determining acute life threatening conditions related to emergency care: Chest x-ray, which revealed hypoventilatory changes and mild left basilar atelectasis or minimal infiltrate, possible trace left effusion  Consultation: - Consulted or discussed management/test interpretation w/ external professional:  None  Consideration for admission or further workup: Shared decision making made with patient on admission for observation and inpatient echo versus follow-up with cardiology and echo outpatient.  Patient feels stable enough to go home and have outpatient echo and cardiology follow-up.         Final Clinical Impression(s) / ED Diagnoses Final diagnoses:  Near syncope    Rx / DC Orders ED Discharge Orders     None         Gretta Began 05/31/23 2046    Glyn Ade, MD 05/31/23 [phone]

## 2023-05-31 NOTE — ED Triage Notes (Signed)
Patient brought in by EMS due to having a near syncopal episode while at the Ortho clinic. Pt was at Ortho getting her left arm manipulated and had a near syncopal episode and sent her here. Pt has sling on arm. When arm is moved, patient has extreme pain.

## 2023-05-31 NOTE — ED Notes (Addendum)
7:18 PM  Report received from previous shift. This RN assumes care of the patient.   7:24 PM  Patient is alert and oriented x 4. She has equal rise and fall of the chest wall with clear lung sounds. Patient reports feeling dizzy and lightheaded while at Orthopedic appointment. Patient left arm in a sling after she had a fall earlier in the week. Patient currently denies dizziness, lightheadedness, CP, SOB, HA, numbness, tingling, fevers, or chills. Patient is in NAD. She currently denies pain. Repeat troponin drawn and sent. Patient vital signs updated. Patient EKG performed. Family present at bedside. Patient and family updated on time for pending results. Patient denies any further needs at this time. Call light in reach. Bed in lowest position.   8:42 PM  Provider present at bedside.   9:05 PM  Discharge instructions discussed with patient and patient voices understanding of discharge instructions. Patient is stable at discharge. Patient in NAD. Patient denies any questions, needs, or concerns regarding discharge. Patient family to transport patient home. Wheelchair provided for patient.

## 2023-05-31 NOTE — Discharge Instructions (Signed)
Today retreated for near syncopal event.  You may take Tylenol for pain.  Please follow-up with cardiology for potential echocardiogram. Thank you for letting us treat you today. After reviewing your labs and imaging, I feel you are safe to go home. Please follow up with your PCP in the next several days and provide them with your records from this visit. Return to the Emergency Room if pain becomes severe or symptoms worsen.

## 2023-06-29 DIAGNOSIS — Z0000 Encounter for general adult medical examination without abnormal findings: Secondary | ICD-10-CM | POA: Diagnosis not present

## 2023-06-29 DIAGNOSIS — H524 Presbyopia: Secondary | ICD-10-CM | POA: Diagnosis not present

## 2023-06-29 DIAGNOSIS — Z1331 Encounter for screening for depression: Secondary | ICD-10-CM | POA: Diagnosis not present

## 2023-06-29 DIAGNOSIS — Z1339 Encounter for screening examination for other mental health and behavioral disorders: Secondary | ICD-10-CM | POA: Diagnosis not present

## 2023-06-29 DIAGNOSIS — N1832 Chronic kidney disease, stage 3b: Secondary | ICD-10-CM | POA: Diagnosis not present

## 2023-07-01 DIAGNOSIS — S42202D Unspecified fracture of upper end of left humerus, subsequent encounter for fracture with routine healing: Secondary | ICD-10-CM | POA: Diagnosis not present

## 2023-07-21 DIAGNOSIS — M25612 Stiffness of left shoulder, not elsewhere classified: Secondary | ICD-10-CM | POA: Diagnosis not present

## 2023-07-27 DIAGNOSIS — M25512 Pain in left shoulder: Secondary | ICD-10-CM | POA: Diagnosis not present

## 2023-07-27 DIAGNOSIS — M25612 Stiffness of left shoulder, not elsewhere classified: Secondary | ICD-10-CM | POA: Diagnosis not present

## 2023-08-02 ENCOUNTER — Encounter: Payer: Self-pay | Admitting: Cardiology

## 2023-08-02 ENCOUNTER — Ambulatory Visit: Payer: Medicare Other

## 2023-08-02 ENCOUNTER — Ambulatory Visit: Payer: Medicare Other | Attending: Cardiology | Admitting: Cardiology

## 2023-08-02 VITALS — BP 143/81 | HR 75 | Resp 16 | Ht 66.0 in | Wt 165.0 lb

## 2023-08-02 DIAGNOSIS — R55 Syncope and collapse: Secondary | ICD-10-CM | POA: Diagnosis not present

## 2023-08-02 DIAGNOSIS — I951 Orthostatic hypotension: Secondary | ICD-10-CM

## 2023-08-02 DIAGNOSIS — E7800 Pure hypercholesterolemia, unspecified: Secondary | ICD-10-CM

## 2023-08-02 NOTE — Patient Instructions (Signed)
Medication Instructions:  Your physician recommends that you continue on your current medications as directed. Please refer to the Current Medication list given to you today.  *If you need a refill on your cardiac medications before your next appointment, please call your pharmacy*  Lab Work: None ordered today. If you have labs (blood work) drawn today and your tests are completely normal, you will receive your results only by: MyChart Message (if you have MyChart) OR A paper copy in the mail If you have any lab test that is abnormal or we need to change your treatment, we will call you to review the results.  Testing/Procedures: Your physician has requested that you have an echocardiogram. Echocardiography is a painless test that uses sound waves to create images of your heart. It provides your doctor with information about the size and shape of your heart and how well your heart's chambers and valves are working. This procedure takes approximately one hour. There are no restrictions for this procedure. Please do NOT wear cologne, perfume, aftershave, or lotions (deodorant is allowed). Please arrive 15 minutes prior to your appointment time.  Please note: We ask at that you not bring children with you during ultrasound (echo/ vascular) testing. Due to room size and safety concerns, children are not allowed in the ultrasound rooms during exams. Our front office staff cannot provide observation of children in our lobby area while testing is being conducted. An adult accompanying a patient to their appointment will only be allowed in the ultrasound room at the discretion of the ultrasound technician under special circumstances. We apologize for any inconvenience.  Your physician has requested that you wear a Zio heart monitor for 14 days. This will be mailed to your home with instructions on how to apply the monitor and how to return it when finished. Please allow 2 weeks after returning the heart  monitor before our office calls you with the results.   Follow-Up: At [facility], you and your health needs are our priority.  As part of our continuing mission to provide you with exceptional heart care, we have created designated Provider Care Teams.  These Care Teams include your primary Cardiologist (physician) and Advanced Practice Providers (APPs -  Physician Assistants and Nurse Practitioners) who all work together to provide you with the care you need, when you need it.  We recommend signing up for the patient portal called "MyChart".  Sign up information is provided on this After Visit Summary.  MyChart is used to connect with patients for Virtual Visits (Telemedicine).  Patients are able to view lab/test results, encounter notes, upcoming appointments, etc.  Non-urgent messages can be sent to your provider as well.   To learn more about what you can do with MyChart, go to [email or web address].    Your next appointment:   1 year(s)  The format for your next appointment:   In Person  Provider:   Tessa Lerner, DO {  Other Instructions ZIO XT- Long Term Monitor Instructions     Your physician has requested you wear a ZIO patch monitor for 3 days.  This is a single patch monitor. [employer] supplies one patch monitor per enrollment. Additional  stickers are not available. Please do not apply patch if you will be having a Nuclear Stress Test,  Echocardiogram, Cardiac CT, MRI, or Chest Xray during the period you would be wearing the  monitor. The patch cannot be worn during these tests. You cannot remove and re-apply the  ZIO XT  patch monitor.  Your ZIO patch monitor will be mailed 3 day USPS to your address on file. It may take 3-5 days  to receive your monitor after you have been enrolled.  Once you have received your monitor, please review the enclosed instructions. Your monitor  has already been registered assigning a specific monitor serial # to you.     Billing and Patient  Assistance Program Information     We have supplied [employer] with any of your insurance information on file for billing purposes.  [employer] offers a sliding scale Patient Assistance Program for patients that do not have  insurance, or whose insurance does not completely cover the cost of the ZIO monitor.  You must apply for the Patient Assistance Program to qualify for this discounted rate.  To apply, please call [employer] at [phone], select option 4, select option 2, ask to apply for  Patient Assistance Program. Meredeth Ide will ask your household income, and how many people  are in your household. They will quote your out-of-pocket cost based on that information.  [employer] will also be able to set up a 105-month, interest-free payment plan if needed.     Applying the monitor     Shave hair from upper left chest.  Hold abrader disc by orange tab. Rub abrader in 40 strokes over the upper left chest as  indicated in your monitor instructions.  Clean area with 4 enclosed alcohol pads. Let dry.  Apply patch as indicated in monitor instructions. Patch will be placed under collarbone on left  side of chest with arrow pointing upward.  Rub patch adhesive wings for 2 minutes. Remove white label marked "1". Remove the white  label marked "2". Rub patch adhesive wings for 2 additional minutes.  While looking in a mirror, press and release button in center of patch. A small green light will  flash 3-4 times. This will be your only indicator that the monitor has been turned on.  Do not shower for the first 24 hours. You may shower after the first 24 hours.  Press the button if you feel a symptom. You will hear a small click. Record Date, Time and  Symptom in the Patient Logbook.  When you are ready to remove the patch, follow instructions on the last 2 pages of Patient  Logbook. Stick patch monitor onto the last page of Patient Logbook.  Place Patient Logbook in the blue and white box. Use locking tab on  box and tape box closed  securely. The blue and white box has prepaid postage on it. Please place it in the mailbox as  soon as possible. Your physician should have your test results approximately 7 days after the  monitor has been mailed back to [facility].  Call [facility] Customer Care at [phone] if you have questions regarding  your ZIO XT patch monitor. Call them immediately if you see an orange light blinking on your  monitor.  If your monitor falls off in less than 4 days, contact our Monitor department at [phone].  If your monitor becomes loose or falls off after 4 days call [employer] at [phone] for  suggestions on securing your monitor.

## 2023-08-02 NOTE — Progress Notes (Unsigned)
Enrolled for [employer] to mail a ZIO XT long term holter monitor to the patients address on file.  

## 2023-08-02 NOTE — Progress Notes (Signed)
Cardiology Office Note:    Date:  08/02/2023  NAME:  Morgan [name]    MRN: [id number] DOB:  June 22, 1941   PCP:  Alysia Penna, MD  Former Cardiology Providers: None Primary Cardiologist:  Tessa Lerner, DO, [facility] (established care 08/02/2023) Electrophysiologist:  None   Referring MD: Dolphus Jenny, PA-C  Reason of Consult: Near syncope  Chief Complaint  Patient presents with   Near Syncope   New Patient (Initial Visit)    History of Present Illness:    Morgan [name] is a 82 y.o. Caucasian female whose past medical history and cardiovascular risk factors includes: HLD, Vitamin D deficiency . She is being seen today for the evaluation of near syncope at the request of Dolphus Jenny, PA-C.  Referred to the practice for evaluation of near syncope.  Patient states that a couple months ago she had a mechanical fall and injured her left shoulder.  She went to [facility] for further evaluation and management.  She was asked to have several x-rays in different positions to further evaluate her shoulder after sitting down she started feeling lightheaded and dizzy.  She felt as if she was in a pass out but did not lose consciousness.  Blood pressures were checked and the systolic blood pressures were low and EMS was called and patient was taken to the hospital for further evaluation and management.  Orthostatic vital signs were positive in the ED. Labs noted no significant electrolyte abnormalities, questionable renal insufficiency with a serum creatinine of 2.1 no baseline for comparison, no leukocytosis, hemoglobin at 11.4 g/dL.   Patient was requested to stay  and be seen by cardiologist the following day but chose to follow-up as outpatient.  Since her episode in October 2024 she has not had any more near syncope.  Patient states that growing up she would feel lightheaded, dizzy, queasy at the site of blood.  She was never formally diagnosed with vasovagal syncope.  She denies any  anginal chest pain or heart failure symptoms.  No prior history of cardiac workup according to her.  Current Medications: Current Meds  Medication Sig   Calcium Carbonate-Vitamin D (CALTRATE 600+D) 600-400 MG-UNIT per tablet Take 1 tablet by mouth daily.   Cholecalciferol (VITAMIN D) 2000 units CAPS Take 2,000 Units by mouth daily.    ferrous sulfate 325 (65 FE) MG tablet Take 325 mg by mouth daily with breakfast.     Allergies:    Atorvastatin and Penicillins   Past Medical History: Past Medical History:  Diagnosis Date   Allergy history unknown    Anxiety    Complication of anesthesia    during second C-section- felt like top of head was being pulled off.   DJD (degenerative joint disease)    History of hematuria    Hx of colonic polyps    Hyperlipidemia    Mitral valve prolapse    Near syncope    Osteopenia    Venous insufficiency     Past Surgical History: Past Surgical History:  Procedure Laterality Date   APPENDECTOMY     appendectomy & drainage of abscess  04/2009   Dr. Janee Morn   CESAREAN SECTION  1967   JOINT REPLACEMENT     right hip   MASS EXCISION Right 10/28/2017   Procedure: EXCISION RIGHT UPPER ARM MASS;  Surgeon: Abigail Miyamoto, MD;  Location: [facility];  Service: General;  Laterality: Right;   right ear surgery  2007   Dr. Algie Coffer cancer  right hand surgery  2003   Dr. Simonne Come   TOTAL HIP ARTHROPLASTY Right 09/2008   Dr. Despina Hick   TOTAL HIP ARTHROPLASTY Left 08/02/2019   Procedure: TOTAL HIP ARTHROPLASTY ANTERIOR APPROACH;  Surgeon: Ollen Gross, MD;  Location: [facility];  Service: Orthopedics;  Laterality: Left;  100 mins    Social History: Social History   Tobacco Use   Smoking status: Never   Smokeless tobacco: Never  Vaping Use   Vaping status: Never Used  Substance Use Topics   Alcohol use: No    Alcohol/week: 0.0 standard drinks of alcohol   Drug use: No    Family History: Family History  Problem Relation Age of Onset    Colon cancer Paternal Aunt 69   Colon cancer Paternal Grandmother 56   Colon cancer Paternal Aunt        unsure age of onset   Colon cancer Paternal Aunt        unsure age of onset    ROS:   Review of Systems  Cardiovascular:  Negative for chest pain, claudication, irregular heartbeat, leg swelling, orthopnea, palpitations, paroxysmal nocturnal dyspnea and syncope. Near-syncope: see HPI. Respiratory:  Negative for shortness of breath.   Hematologic/Lymphatic: Negative for bleeding problem.    EKGs/Labs/Other Studies Reviewed:   EKG: EKG Interpretation Date/Time:  Monday August 02 2023 11:01:52 EST Ventricular Rate:  67 Text Interpretation: Normal sinus rhythm When compared with ECG of 31-May-2023 19:24, No significant change since last tracing Confirmed by Tessa Lerner [phone]) on 08/02/2023 11:12:57 AM  Echocardiogram: NA  Labs:    Latest Ref Rng & Units 05/31/2023    6:12 PM 08/03/2019    2:39 AM 07/31/2019    1:54 PM  CBC  WBC 4.0 - 10.5 K/uL 7.9  7.2  2.5   Hemoglobin 12.0 - 15.0 g/dL 19.1  47.8  29.5   Hematocrit 36.0 - 46.0 % 38.2  38.1  41.3   Platelets 150 - 400 K/uL 271  238  257        Latest Ref Rng & Units 05/31/2023    6:12 PM 08/03/2019    2:39 AM 07/31/2019    1:54 PM  BMP  Glucose 70 - 99 mg/dL 621  308  657   BUN 8 - 23 mg/dL 22  16  13    Creatinine 0.44 - 1.00 mg/dL 8.46  9.62  9.52   Sodium 135 - 145 mmol/L 136  135  141   Potassium 3.5 - 5.1 mmol/L 3.9  4.7  3.8   Chloride 98 - 111 mmol/L 104  103  108   CO2 22 - 32 mmol/L 24  25  25    Calcium 8.9 - 10.3 mg/dL 9.1  8.8  9.0       Latest Ref Rng & Units 05/31/2023    6:12 PM 08/03/2019    2:39 AM 07/31/2019    1:54 PM  CMP  Glucose 70 - 99 mg/dL 841  324  401   BUN 8 - 23 mg/dL 22  16  13    Creatinine 0.44 - 1.00 mg/dL 0.27  2.53  6.64   Sodium 135 - 145 mmol/L 136  135  141   Potassium 3.5 - 5.1 mmol/L 3.9  4.7  3.8   Chloride 98 - 111 mmol/L 104  103  108   CO2 22 - 32 mmol/L 24   25  25    Calcium 8.9 - 10.3 mg/dL 9.1  8.8  9.0   Total  Protein 6.5 - 8.1 g/dL 6.6   6.0   Total Bilirubin 0.3 - 1.2 mg/dL 0.4   0.3   Alkaline Phos 38 - 126 U/L 84   76   AST 15 - 41 U/L 26   20   ALT 0 - 44 U/L 25   17     Lab Results  Component Value Date   CHOL 231 (H) 11/23/2011   HDL 62.30 11/23/2011   LDLDIRECT 157.8 11/23/2011   TRIG 64.0 11/23/2011   CHOLHDL 4 11/23/2011   No results for input(s): "LIPOA" in the last 8760 hours. No components found for: "NTPROBNP" No results for input(s): "PROBNP" in the last 8760 hours. No results for input(s): "TSH" in the last 8760 hours.  CBC With Differential/Platelet Reviewed date:05/28/2023 03:01:23 PM Interpretation: Performing Lab: Notes/Report:  WBC 2.2 3.4-10.8 x10E3/uL    RBC 4.48 3.77-5.28 x10E6/uL    Hemoglobin 11.7 11.1-15.9 g/dL    Hematocrit 40.9 81.1-91.4 %    MCV 85 79-97 fL    MCH 26.1 26.6-33.0 pg    MCHC 30.7 31.5-35.7 g/dL    RDW 78.2 95.6-21.3 %    Platelets 296 150-450 x10E3/uL    Neutrophils 41 Not Estab. %    Lymphs 41 Not Estab. %    Monocytes 12 Not Estab. %    Eos 5 Not Estab. %    Basos 1 Not Estab. %    Immature Cells NP      Neutrophils (Absolute) 0.9 1.4-7.0 x10E3/uL    Lymphs (Absolute) 0.9 0.7-3.1 x10E3/uL    Monocytes(Absolute) 0.3 0.1-0.9 x10E3/uL    Eos (Absolute) 0.1 0.0-0.4 x10E3/uL    Baso (Absolute) 0.0 0.0-0.2 x10E3/uL    Immature Granulocytes 0 Not Estab. %    Immature Grans (Abs) 0.0 0.0-0.1 x10E3/uL    NRBC NP      Hematology Comments: NP      Lipid Panel And Chol/HDL Ratio Reviewed date:05/28/2023 03:01:24 PM Interpretation: Performing Lab: Notes/Report:  Cholesterol, Total 247 100-199 mg/dL    Triglycerides 77 0-865 mg/dL    HDL Cholesterol 74 >78 mg/dL    VLDL Cholesterol Cal 13 5-40 mg/dL    LDL Chol Calc (NIH) 160 0-99 mg/dL    LDL Calc Comment: NP      T. Chol/HDL Ratio 3.3 0.0-4.4 ratio T. Chol/HDL Ratio  Men Women  1/2 Avg.Risk 3.4 3.3  Avg.Risk 5.0 4.4   2X Avg.Risk 9.6 7.1  3X Avg.Risk 23.4 11.0   Comp. Metabolic Panel (14) Reviewed date:05/28/2023 03:01:24 PM Interpretation: Performing Lab: Notes/Report:  Glucose 107 70-99 mg/dL    BUN 15 4-69 mg/dL    Creatinine 6.29 5.28-4.13 mg/dL    eGFR 46 >24 MW/NUU/7.25    BUN/Creatinine Ratio 13 12-28    Sodium 145 134-144 mmol/L    Potassium 5.0 3.5-5.2 mmol/L    Chloride 108 96-106 mmol/L    Carbon Dioxide, Total 22 20-29 mmol/L    Calcium 9.9 8.7-10.3 mg/dL    Protein, Total 6.2 3.6-6.4 g/dL    Albumin 4.1 4.0-3.4 g/dL    Globulin, Total 2.1 1.5-4.5 g/dL    Bilirubin, Total 0.4 0.0-1.2 mg/dL    Alkaline Phosphatase 97 44-121 IU/L    AST (SGOT) 17 0-40 IU/L    ALT (SGPT) 10 0-32 IU/L      Physical Exam:    Today's Vitals   08/02/23 1058 08/02/23 1102 08/02/23 1104 08/02/23 1105  BP: (!) 143/81     Pulse: 75     Resp: 16  SpO2: 97% 96% 96% 96%  Weight: 165 lb (74.8 kg)     Height: 5\' 6"  (1.676 m)      Body mass index is 26.63 kg/m. Wt Readings from Last 3 Encounters:  08/02/23 165 lb (74.8 kg)  05/31/23 185 lb 3 oz (84 kg)  08/02/19 185 lb 3 oz (84 kg)   Orthostatic VS for the past 72 hrs (Last 3 readings):  Orthostatic BP Patient Position BP Location Cuff Size Orthostatic Pulse  08/02/23 1105 132/76 Standing Left Arm Normal 80  08/02/23 1104 136/72 Sitting Left Arm Normal 67  08/02/23 1102 150/75 Supine Left Arm Normal 66      Physical Exam  Constitutional: No distress.  hemodynamically stable  Eyes:  Arcus senilis  Neck: No JVD present.  Cardiovascular: Normal rate, regular rhythm, S1 normal and S2 normal. Exam reveals no gallop, no S3 and no S4.  No murmur heard. Pulmonary/Chest: Effort normal and breath sounds normal. No stridor. She has no wheezes. She has no rales.  Abdominal: Soft. Bowel sounds are normal. She exhibits no distension. There is no abdominal tenderness.  Musculoskeletal:        General: No edema.     Cervical back: Neck supple.   Neurological: She is alert and oriented to person, place, and time. She has intact cranial nerves (2-12).  Skin: Skin is warm.     Impression & Recommendation(s):  Impression:   ICD-10-CM   1. Near syncope  R55 EKG 12-Lead    ECHOCARDIOGRAM COMPLETE    LONG TERM MONITOR (3-14 DAYS)    2. Orthostatic hypotension  I95.1     3. Pure hypercholesterolemia  E78.00        Recommendation(s):  Near syncope Orthostatic hypotension Her symptoms back in October 2024 likely precipitated by vasovagal/orthostatic physiology. No reoccurrence of symptoms. Known history of vasovagal episodes growing up. EKG today is nonischemic. Orthostatic vital signs negative today Echo will be ordered to evaluate for structural heart disease and left ventricular systolic function. Zio patch to evaluate for dysrhythmias We discussed the pathophysiology of vasovagal syncope during today's visit.  We discussed counterpressure maneuvers which I recommended to avoid complete loss of consciousness.  She should avoid standing up when the symptoms began.  She should also try liberalizing salt intake and fluids.  She can also try compression stockings and recumbent exercise.   Pure hypercholesterolemia Longstanding history of hyperlipidemia. Arcus senilis on physical examination. Recommended lipid-lowering agents-patient would like to defer to PCP at this time. I offered her to schedule a coronary calcium score for further risk stratification but she would like to hold off.   Orders Placed:  Orders Placed This Encounter  Procedures   LONG TERM MONITOR (3-14 DAYS)    Standing Status:   Future    Number of Occurrences:   1    Expected Date:   09/02/2023    Expiration Date:   08/01/2024    Scheduling Instructions:     Pt will place when ready    Where should this test be performed?:   [facility]    Does the patient have an implanted cardiac device?:   No    Prescribed days of wear:   14    Type of  enrollment:   Home Enrollment    Vendor::   [employer]   EKG 12-Lead   ECHOCARDIOGRAM COMPLETE    Standing Status:   Future    Expected Date:   09/02/2023    Expiration Date:  08/01/2024    Scheduling Instructions:     Pt will schedule when ready    Where should this test be performed:   [facility])    Does the patient weigh less than or greater than 250 lbs?:   Patient weighs less than 250 lbs    Perflutren DEFINITY (image enhancing agent) should be administered unless hypersensitivity or allergy exist:   Administer Perflutren    Reason for exam-Echo:   Other-Full Diagnosis List    Full ICD-10/Reason for Exam:   Near syncope [692301]    As part of medical decision making results of the labs from [facility] Everywhere dated 05/28/2023, ER visit documentation 05/31/2023, EKG were reviewed independently at today's visit.   Final Medication List:   No orders of the defined types were placed in this encounter.   Medications Discontinued During This Encounter  Medication Reason   aspirin EC 325 MG EC tablet Patient Preference   HYDROcodone-acetaminophen (NORCO/VICODIN) 5-325 MG tablet Patient Preference   methocarbamol (ROBAXIN) 500 MG tablet Patient Preference   sodium chloride (MURO 128) 5 % ophthalmic solution Patient Preference   traMADol (ULTRAM) 50 MG tablet Patient Preference     Current Outpatient Medications:    Calcium Carbonate-Vitamin D (CALTRATE 600+D) 600-400 MG-UNIT per tablet, Take 1 tablet by mouth daily., Disp: , Rfl:    Cholecalciferol (VITAMIN D) 2000 units CAPS, Take 2,000 Units by mouth daily. , Disp: , Rfl:    ferrous sulfate 325 (65 FE) MG tablet, Take 325 mg by mouth daily with breakfast., Disp: , Rfl:   Consent:   NA  Disposition:   1 year follow-up sooner if needed.  Patient may be asked to follow-up sooner based on the results of the above-mentioned testing.  Her questions and concerns were addressed to her satisfaction. She voices  understanding of the recommendations provided during this encounter.    Signed, Tessa Lerner, DO, [facility]  [facility]  [address] [place], [place] [address] 08/02/2023 12:43 PM

## 2023-08-04 DIAGNOSIS — M25612 Stiffness of left shoulder, not elsewhere classified: Secondary | ICD-10-CM | POA: Diagnosis not present

## 2023-08-04 DIAGNOSIS — S42202D Unspecified fracture of upper end of left humerus, subsequent encounter for fracture with routine healing: Secondary | ICD-10-CM | POA: Diagnosis not present

## 2023-08-04 DIAGNOSIS — M25512 Pain in left shoulder: Secondary | ICD-10-CM | POA: Diagnosis not present

## 2023-08-20 DIAGNOSIS — X32XXXD Exposure to sunlight, subsequent encounter: Secondary | ICD-10-CM | POA: Diagnosis not present

## 2023-08-20 DIAGNOSIS — H6692 Otitis media, unspecified, left ear: Secondary | ICD-10-CM | POA: Diagnosis not present

## 2023-08-20 DIAGNOSIS — L570 Actinic keratosis: Secondary | ICD-10-CM | POA: Diagnosis not present

## 2023-08-20 DIAGNOSIS — L820 Inflamed seborrheic keratosis: Secondary | ICD-10-CM | POA: Diagnosis not present

## 2023-08-20 DIAGNOSIS — H6591 Unspecified nonsuppurative otitis media, right ear: Secondary | ICD-10-CM | POA: Diagnosis not present

## 2023-09-07 DIAGNOSIS — R42 Dizziness and giddiness: Secondary | ICD-10-CM | POA: Diagnosis not present

## 2023-09-07 DIAGNOSIS — H8113 Benign paroxysmal vertigo, bilateral: Secondary | ICD-10-CM | POA: Diagnosis not present

## 2023-09-15 DIAGNOSIS — R2681 Unsteadiness on feet: Secondary | ICD-10-CM | POA: Diagnosis not present

## 2023-09-15 DIAGNOSIS — M6281 Muscle weakness (generalized): Secondary | ICD-10-CM | POA: Diagnosis not present

## 2023-09-17 DIAGNOSIS — N390 Urinary tract infection, site not specified: Secondary | ICD-10-CM | POA: Diagnosis not present

## 2023-10-11 ENCOUNTER — Ambulatory Visit (HOSPITAL_COMMUNITY): Payer: Medicare Other | Attending: Cardiology

## 2023-10-11 DIAGNOSIS — R55 Syncope and collapse: Secondary | ICD-10-CM

## 2023-10-11 LAB — ECHOCARDIOGRAM COMPLETE
Area-P 1/2: 4.6 cm2
S' Lateral: 2.8 cm

## 2023-10-21 ENCOUNTER — Telehealth: Payer: Self-pay | Admitting: Cardiology

## 2023-10-21 NOTE — Telephone Encounter (Signed)
 Patient is calling to get update on echo results. Please advise.

## 2023-10-22 NOTE — Telephone Encounter (Signed)
 The patient has been notified of the result and verbalized understanding.  All questions (if any) were answered. Cherylann Banas, [place] 10/22/2023 9:14 AM

## 2024-06-27 DIAGNOSIS — E7849 Other hyperlipidemia: Secondary | ICD-10-CM | POA: Diagnosis not present

## 2024-06-27 DIAGNOSIS — D649 Anemia, unspecified: Secondary | ICD-10-CM | POA: Diagnosis not present

## 2024-07-04 DIAGNOSIS — Z1339 Encounter for screening examination for other mental health and behavioral disorders: Secondary | ICD-10-CM | POA: Diagnosis not present

## 2024-07-04 DIAGNOSIS — R7302 Impaired glucose tolerance (oral): Secondary | ICD-10-CM | POA: Diagnosis not present

## 2024-07-04 DIAGNOSIS — Z23 Encounter for immunization: Secondary | ICD-10-CM | POA: Diagnosis not present

## 2024-07-04 DIAGNOSIS — Z0000 Encounter for general adult medical examination without abnormal findings: Secondary | ICD-10-CM | POA: Diagnosis not present

## 2024-07-04 DIAGNOSIS — N1832 Chronic kidney disease, stage 3b: Secondary | ICD-10-CM | POA: Diagnosis not present

## 2024-07-04 DIAGNOSIS — Z1331 Encounter for screening for depression: Secondary | ICD-10-CM | POA: Diagnosis not present
